# Patient Record
Sex: Female | Born: 1935 | Race: White | Hispanic: No | Smoking: Never smoker
Health system: Southern US, Community
[De-identification: ages and names within clinical notes are randomized; demographics above are authoritative.]

## PROBLEM LIST (undated history)

## (undated) DIAGNOSIS — K5790 Diverticulosis of intestine, part unspecified, without perforation or abscess without bleeding: Secondary | ICD-10-CM

## (undated) DIAGNOSIS — Z86010 Personal history of colonic polyps: Secondary | ICD-10-CM

## (undated) DIAGNOSIS — Z860101 Personal history of adenomatous and serrated colon polyps: Secondary | ICD-10-CM

## (undated) DIAGNOSIS — K219 Gastro-esophageal reflux disease without esophagitis: Secondary | ICD-10-CM

## (undated) DIAGNOSIS — I639 Cerebral infarction, unspecified: Secondary | ICD-10-CM

## (undated) DIAGNOSIS — K449 Diaphragmatic hernia without obstruction or gangrene: Secondary | ICD-10-CM

## (undated) DIAGNOSIS — G459 Transient cerebral ischemic attack, unspecified: Secondary | ICD-10-CM

## (undated) DIAGNOSIS — E039 Hypothyroidism, unspecified: Secondary | ICD-10-CM

## (undated) DIAGNOSIS — K222 Esophageal obstruction: Secondary | ICD-10-CM

## (undated) DIAGNOSIS — D369 Benign neoplasm, unspecified site: Secondary | ICD-10-CM

## (undated) HISTORY — DX: Transient cerebral ischemic attack, unspecified: G45.9

## (undated) HISTORY — DX: Benign neoplasm, unspecified site: D36.9

## (undated) HISTORY — DX: Personal history of adenomatous and serrated colon polyps: Z86.0101

## (undated) HISTORY — DX: Diverticulosis of intestine, part unspecified, without perforation or abscess without bleeding: K57.90

## (undated) HISTORY — DX: Hypothyroidism, unspecified: E03.9

## (undated) HISTORY — DX: Personal history of colonic polyps: Z86.010

## (undated) HISTORY — DX: Diaphragmatic hernia without obstruction or gangrene: K44.9

## (undated) HISTORY — PX: APPENDECTOMY: SHX54

## (undated) HISTORY — DX: Cerebral infarction, unspecified: I63.9

## (undated) HISTORY — DX: Gastro-esophageal reflux disease without esophagitis: K21.9

## (undated) HISTORY — DX: Esophageal obstruction: K22.2

## (undated) HISTORY — PX: ABDOMINAL HYSTERECTOMY: SHX81

---

## 1976-01-27 HISTORY — PX: OTHER SURGICAL HISTORY: SHX169

## 1999-01-27 HISTORY — PX: OTHER SURGICAL HISTORY: SHX169

## 2002-01-26 HISTORY — PX: ESOPHAGOGASTRODUODENOSCOPY: SHX1529

## 2002-02-01 ENCOUNTER — Ambulatory Visit (HOSPITAL_COMMUNITY): Admission: RE | Admit: 2002-02-01 | Discharge: 2002-02-01 | Payer: Self-pay | Admitting: Internal Medicine

## 2004-09-08 ENCOUNTER — Ambulatory Visit: Payer: Self-pay | Admitting: Internal Medicine

## 2005-11-06 ENCOUNTER — Ambulatory Visit: Payer: Self-pay | Admitting: Internal Medicine

## 2006-07-27 HISTORY — PX: COLONOSCOPY: SHX174

## 2006-08-19 ENCOUNTER — Encounter: Payer: Self-pay | Admitting: Internal Medicine

## 2006-08-19 ENCOUNTER — Ambulatory Visit: Payer: Self-pay | Admitting: Internal Medicine

## 2006-08-19 ENCOUNTER — Ambulatory Visit (HOSPITAL_COMMUNITY): Admission: RE | Admit: 2006-08-19 | Discharge: 2006-08-19 | Payer: Self-pay | Admitting: Internal Medicine

## 2006-08-19 DIAGNOSIS — D369 Benign neoplasm, unspecified site: Secondary | ICD-10-CM

## 2006-08-19 HISTORY — DX: Benign neoplasm, unspecified site: D36.9

## 2008-05-09 DIAGNOSIS — E039 Hypothyroidism, unspecified: Secondary | ICD-10-CM | POA: Insufficient documentation

## 2008-05-09 DIAGNOSIS — K219 Gastro-esophageal reflux disease without esophagitis: Secondary | ICD-10-CM

## 2008-05-09 DIAGNOSIS — G459 Transient cerebral ischemic attack, unspecified: Secondary | ICD-10-CM | POA: Insufficient documentation

## 2008-05-09 DIAGNOSIS — N809 Endometriosis, unspecified: Secondary | ICD-10-CM | POA: Insufficient documentation

## 2008-05-09 DIAGNOSIS — K222 Esophageal obstruction: Secondary | ICD-10-CM | POA: Insufficient documentation

## 2008-05-10 ENCOUNTER — Ambulatory Visit: Payer: Self-pay | Admitting: Internal Medicine

## 2008-05-11 DIAGNOSIS — D126 Benign neoplasm of colon, unspecified: Secondary | ICD-10-CM | POA: Insufficient documentation

## 2009-06-12 ENCOUNTER — Encounter: Payer: Self-pay | Admitting: Gastroenterology

## 2009-10-23 ENCOUNTER — Telehealth (INDEPENDENT_AMBULATORY_CARE_PROVIDER_SITE_OTHER): Payer: Self-pay

## 2010-02-25 NOTE — Progress Notes (Signed)
Summary: pt needs printed rx for nexium  Phone Note Call from Patient Call back at Home Phone [phone]   Caller: Patient Summary of Call: pt called- she is trying to get patient assistance for nexium. she has already filled out paperwork and sent it to [employer]. She needs printed rx faxed to them at [phone]. Initial call taken by: Hendricks Limes LPN,  October 23, 2009 10:46 AM     Appended Document: pt needs printed rx for nexium    Prescriptions: NEXIUM 40 MG CPDR (ESOMEPRAZOLE MAGNESIUM) one by mouth 30 mins before breakfast daily  #30 x 11   Entered and Authorized by:   Leanna Battles. Dixon Boos   Signed by:   Leanna Battles Lewis PA-C on 10/23/2009   Method used:   Printed then faxed to ...         RxID:   [id number]     Appended Document: pt needs printed rx for nexium rx faxed to [facility]

## 2010-02-25 NOTE — Medication Information (Signed)
Summary: RX Folder  RX Folder   Imported By: Peggyann Shoals 06/12/2009 09:10:40  _____________________________________________________________________  External Attachment:    Type:   Image     Comment:   External Document  Appended Document: RX Folder-nexium Prescriptions: NEXIUM 40 MG CPDR (ESOMEPRAZOLE MAGNESIUM) one by mouth 30 mins before breakfast daily  #30 x 11   Entered and Authorized by:   Leanna Battles. Dixon Boos   Signed by:   Leanna Battles Dixon Boos on 06/12/2009   Method used:   Electronically to        [facility]* (retail)       [address]       [place], [place]  [address]       Ph: [phone]       Fax: [phone]   RxID:   [id number]

## 2010-05-13 ENCOUNTER — Encounter: Payer: Self-pay | Admitting: Internal Medicine

## 2010-05-27 ENCOUNTER — Encounter: Payer: Self-pay | Admitting: Gastroenterology

## 2010-05-27 ENCOUNTER — Ambulatory Visit (INDEPENDENT_AMBULATORY_CARE_PROVIDER_SITE_OTHER): Payer: Medicare Other | Admitting: Gastroenterology

## 2010-05-27 DIAGNOSIS — K219 Gastro-esophageal reflux disease without esophagitis: Secondary | ICD-10-CM

## 2010-05-27 DIAGNOSIS — D126 Benign neoplasm of colon, unspecified: Secondary | ICD-10-CM

## 2010-05-27 MED ORDER — ESOMEPRAZOLE MAGNESIUM 40 MG PO CPDR: 40.0000 mg | DELAYED_RELEASE_CAPSULE | Freq: Every day | ORAL | Status: DC

## 2010-05-27 NOTE — Assessment & Plan Note (Signed)
Continue Nexium 40 mg daily. New Rx will be mailed to [employer] [facility] as well as letter to have medication delivered to patient's home address.

## 2010-05-27 NOTE — Assessment & Plan Note (Signed)
Due for surveillance TCS in 07/2011. OV in 06/2011.

## 2010-05-27 NOTE — Progress Notes (Addendum)
Primary Care Physician: Juliette Alcide, [place]  Primary Gastroenterologist:  Dr. Roetta Sessions  Chief Complaint  Patient presents with  . Follow-up    gerd    HPI: Alexandria Jordan is a 77 y.o. female here for followup of chronic GERD. She is doing well on Nexium 40 mg daily. Denies any heartburn, abdominal pain, vomiting, dysphagia, constipation, diarrhea, melena, rectal bleeding. She states she needs a prescription sent to [employer] assistance program as well as a letter asking them to deliver the medication to her house rather than in our office (she lives in [place]). Since her last office visit here she was treated for pneumonia as an outpatient. She transferred her care from Dr. Burna Mortimer to [facility].  Current Outpatient Prescriptions  Medication Sig Dispense Refill  . Aspirin-Dipyridamole (AGGRENOX PO) Take by mouth.        . calcium carbonate (OS-CAL) 600 MG TABS Take 600 mg by mouth 2 (two) times daily with a meal.        . esomeprazole (NEXIUM) 40 MG capsule Take 40 mg by mouth daily before breakfast.        . folic acid (FOLVITE) 1 MG tablet Take 1 mg by mouth daily.        Marland Kitchen levothyroxine (SYNTHROID, LEVOTHROID) 100 MCG tablet Take 100 mcg by mouth daily.        . Thiamine HCl (VITAMIN B-1) 100 MG tablet Take 100 mg by mouth daily.          Allergies as of 05/27/2010 - Review Complete 05/27/2010  Allergen Reaction Noted  . Fulvicin u-f (griseofulvin)  05/22/2010    ROS:  General: Negative for anorexia, weight loss, fever, chills, fatigue, weakness. ENT: Negative for hoarseness, difficulty swallowing , nasal congestion. CV: Negative for chest pain, angina, palpitations, dyspnea on exertion, peripheral edema.  Respiratory: Negative for dyspnea at rest, dyspnea on exertion, cough, sputum, wheezing.  GI: See history of present illness. GU:  Negative for dysuria, hematuria, urinary incontinence, urinary frequency, nocturnal urination.  Endo: Negative for unusual  weight change.    Physical Examination:   BP 125/85  Pulse 92  Temp(Src) 97.6 F (36.4 C) (Tympanic)  Ht 5\' 7"  (1.702 m)  Wt 202 lb 6.4 oz (91.808 kg)  BMI 31.70 kg/m2  General: Well-nourished, well-developed in no acute distress.  Eyes: No icterus. Mouth: Oropharyngeal mucosa moist and pink , no lesions erythema or exudate. Lungs: Clear to auscultation bilaterally.  Heart: Regular rate and rhythm, no murmurs rubs or gallops.  Abdomen: Bowel sounds are normal, nontender, nondistended, no hepatosplenomegaly or masses, no abdominal bruits or hernia , no rebound or guarding.   Extremities: No lower extremity edema.  Neuro: Alert and oriented x 4   Skin: Warm and dry, no jaundice.   Psych: Alert and cooperative, normal mood and affect.

## 2010-05-28 NOTE — Progress Notes (Signed)
Letter and Rx faxed to [employer]

## 2010-06-10 NOTE — Op Note (Signed)
NAME:  Alexandria Jordan, Alexandria Jordan               ACCOUNT NO.:  [id number]   MEDICAL RECORD NO.:  [id number]          PATIENT TYPE:  AMB   LOCATION:  DAY                           FACILITY:  [facility]   PHYSICIAN:  R. Roetta Sessions, M.D. DATE OF BIRTH:  02-20-1931   DATE OF PROCEDURE:  08/19/2006  DATE OF DISCHARGE:                               OPERATIVE REPORT   PROCEDURE:  Colonoscopy with biopsy.   INDICATIONS FOR PROCEDURE:  A 77 year old lady sent down by the courtesy  of Dr. __________ for colorectal cancer screening.  She had a  colonoscopy a number of years ago down at [facility], and there is no family  history of colon cancer.  She does not have any lower GI tract symptoms  currently.  Colonoscopy is now being done.  This has been discussed with  the patient at length.  Potential risks, benefits, and alternatives have  been reviewed, questions answered.  She is agreeable.  Please see  documentation in medical record.   PROCEDURE NOTE:  O2 saturations, blood pressure, pulse, respirations  were monitored throughout the entire procedure.  Conscious sedation of  Versed 3 mg IV, Demerol 75 mg IV in divided doses.   INSTRUMENT:  Pentax video chip system.   FINDINGS:  Digital rectal exam revealed no abnormalities.   ENDOSCOPIC FINDINGS:  Prep was suboptimal.   COLON:  Colonic mucosa was surveyed from the rectosigmoid junction to  the left transverse right colon to the appendiceal orifice, ileocecal  valve, and cecum.  This juncture was well seen and photographed for the  record.  From this level, the scope was slowly withdrawn.  All  previously mentioned mucosal surfaces were again seen.  The patient had  scattered sigmoid diverticula.  There were two 3 mm polyps in the  ascending colon which were cold biopsied/removed.  The remainder of the  colonic mucosa appeared normal.  The scope was pulled down to the rectum  where a thorough examination of the rectal mucosa including retroflexion  and view  of the anal verge demonstrated anal papilla and internal  hemorrhoids only.  The patient tolerated the procedure well and was  reacted in endoscopy.   IMPRESSION:  1. Internal hemorrhoids, anal papilla, otherwise normal rectum.  2. Left-sided diverticula.  3. Diminutive ascending colon polyps chest pain cold biopsy removal.      The remainder of the colonic mucosa appeared normal.   RECOMMENDATIONS:  1. Diverticulosis literature provided to [name].  2. Follow up on pathology.  3. Further recommendations to follow.      Jonathon Bellows, M.D.  Electronically Signed     RMR/MEDQ  D:  08/19/2006  T:  08/19/2006  Job:  [id number]   cc:   Dr. __________, [facility]

## 2010-06-13 NOTE — Op Note (Signed)
   NAME:  Alexandria Jordan, Alexandria Jordan                         ACCOUNT NO.:  [id number]   MEDICAL RECORD NO.:  [id number]                   PATIENT TYPE:  AMB   LOCATION:  DAY                                  FACILITY:  [facility]   PHYSICIAN:  R. Roetta Sessions, M.D.              DATE OF BIRTH:  September 23, 1931   DATE OF PROCEDURE:  02/01/2002  DATE OF DISCHARGE:                                 OPERATIVE REPORT   PROCEDURE:  Diagnostic esophagogastroduodenoscopy.   INDICATION FOR PROCEDURE:  The patient is a 77 year old lady with  intermittent nausea, vomiting, atypical chest discomfort.  Marked  improvement with a two-week course of Nexium.  Cardiac evaluation negative.  EGD is now being done to further evaluate her symptoms.  The approach has  been discussed at length with the patient previously, and the potential  risks, benefits, and alternatives have been reviewed, questions answered.  ASA-2.   DESCRIPTION OF PROCEDURE:  O2 saturation, blood pressure, pulse,  respirations were monitored throughout the entire procedure.   Conscious sedation:  Versed 3 mg IV, Demerol 50 mg IV in divided doses.   Instrument:  Olympus video chip adult gastroscope.   Findings:  Examination of the tubular esophagus revealed a Schatzki's ring.  The esophageal mucosa otherwise appeared normal.  The EG junction was easily  traversed.   Stomach:  The gastric cavity was empty and insufflated well with air.  A  thorough examination of the gastric mucosa, including a retroflexed view of  the proximal stomach and esophagogastric junction, demonstrated only a  moderate-sized hiatal hernia.  The pylorus was patent and easily traversed.   Duodenum:  The bulb and second portion appeared normal.   Therapeutic/diagnostic maneuvers performed:  None.   The patient tolerated the procedure well, was reactive in endoscopy.   IMPRESSION:  1. Schatzki's ring, otherwise normal esophageal mucosa.  2. Small to moderate-sized hiatal  hernia.  Remainder of stomach appeared     normal.  3. Normal D1, D2.  4. The ring was not manipulated because the patient denies dysphagia.    RECOMMENDATIONS:  1. Continue Nexium 40 mg orally every morning.  2. Office visit with me in six weeks.                                               Jonathon Bellows, M.D.    RMR/MEDQ  D:  02/01/2002  T:  02/01/2002  Job:  [id number]   cc:   [place], [place] [address] Genene Churn. Burna Mortimer MD, [facility]

## 2010-06-13 NOTE — Consult Note (Signed)
NAME:  Alexandria Jordan, SISLER                         ACCOUNT NO.:  [id number]   MEDICAL RECORD NO.:  [id number]                  PATIENT TYPE:   LOCATION:                                       FACILITY:  [facility]   PHYSICIAN:  R. Roetta Sessions, M.D.              DATE OF BIRTH:  27-Sep-1931   DATE OF CONSULTATION:  01/04/2002  DATE OF DISCHARGE:                                   CONSULTATION   GASTROENTEROLOGY CONSULTATION:   REASON FOR CONSULTATION:  Chest symptoms, chest discomfort, flushing.   HISTORY OF PRESENT ILLNESS:  The patient is a 77 year old lady referred down  from [facility] at courtesy of Dr. Burna Mortimer to further evaluate at least a  couple-month history of somewhat obscure symptoms characterized by  diaphoresis, tightness in her chest, intermittent nausea and vomiting, says  she has heartburn occasionally but differentiates occasional heartburn  from the above-mentioned symptoms, does not have any odynophagia or  dysphagia, occasionally gets nauseated and vomits, does not have any  associated abdominal pain, it may occur after meals or fasting, has not had  any melena or rectal bleeding, no change in bowel habits otherwise (denies  constipation or diarrhea).  She had what sounds like a fairly extensive  cardiac workup by Dr. Willette Pa in [place], including a stress test,  everything turned out negative or normal as she reports.  She has had  multiple laboratory studies through the [facility].  I have  copies of a metabolic-7, chem-20's and a CBC, she had normal LFTs as well.  She tells me she was started on a 2-week course of Nexium 40 mg orally daily  with marked/dramatic improvement in her symptoms.  She came off the  medication and was subsequently put on some Protonix which she did not feel  helped her as much.  She does not take any nonsteroidals.  She has no  history of peptic ulcer disease.  She tells me that she has diaphoresis and  sometimes flushing ever  since her neurologist at [facility] took her off Provera  some 2 years ago after she had a TIA.  She was told at that time that she  could expect such reactions per her report.  She had a colonoscopy by Dr.  June Leap down at [facility] 4 or 5 years ago; she tells me only diverticulosis  was found.  There is no family history of gastrointestinal malignancy.  She  has had a 6-pound increase in weight in the past 6 months.   PAST MEDICAL HISTORY:  Past medical history significant for what sounds like  a TIA in 2001 and hypothyroidism.   PAST SURGICAL HISTORY:  Past surgery is hysterectomy for endometriosis and  fibroids 1978, bladder tack in 2001.   CURRENT MEDICATIONS:  1. Protonix 40 mg orally daily.  2. Os-Cal b.i.d.  3. Vitamin B daily.  4. Synthroid 1 mg daily.  5. Aggrenox b.i.d.  ALLERGIES:  Frelbicin.   FAMILY HISTORY:  As described above, there is no history of chronic GI or  liver disease.  Father is alive at age 51.  Mother is alive at age 22.   SOCIAL HISTORY:  The patient is widowed.  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  No tobacco or alcohol.   REVIEW OF SYSTEMS:  Has not had any chest pain on exertion, dyspnea, fever,  chills, weight gain as related to above.   PHYSICAL EXAMINATION:  GENERAL:  Pleasant 77 year old lady resting  comfortably.  VITAL SIGNS:  Weight 198, height 5 feet 7 inches, temperature 98.8, BP  130/92, pulse 88.  SKIN:  Warm and dry.  There is no jaundice; no continuous stigmata of  chronic liver disease.  HEENT:  No scleral icterus.  Oral cavity has no lesions and dentition in  fair state of repair.  NECK:  JVD is not prominent.  CHEST:  Lungs clear to auscultation.  CARDIAC:  Regular rate and rhythm without murmur, gallop, rub.  BREASTS:  Deferred.  ABDOMEN:  Nondistended, positive bowel sounds, soft, nontender; no  appreciable mass or organomegaly.  EXTREMITIES:  No edema.   IMPRESSION:  The patient is  a delightful 77 year old lady with a 61-month-so  history of intermittent nausea, vomiting, and atypical chest discomfort.  She has occasional typical reflux symptoms.  She describes her discomfort  from time to time as being squeezing with intermittent diaphoresis.   I suspect her diaphoresis may largely be related to hot flashes.   At least some of her symptoms may well be secondary to gastroesophageal  reflux.  It is reassuring to note that she had a negative cardiac evaluation  recently.  She has really not had any sort of abdominal pain so I doubt  gallbladder disease or other intra-abdominal pathology.   It is significant that she experienced a significant improvement in her  symptoms with a 2-week course of Nexium.  Protonix has not done as well.  Her symptoms have had somewhat of a crescendo pace.   RECOMMENDATIONS:  I have offered the patient EGD in the near future to  directly visualize the luminal surfaces of her upper GI tract to further  evaluate her symptoms.  Discussed this approach with the patient.  Potential  risks, benefits, alternatives have been reviewed.  I have asked her to stop  Protonix and go back on Nexium.  I have  given her a 37-month supply of free samples.  She is to take one 40 mg  capsule at least 30 minutes before breakfast daily.  I will make further  recommendations once EGD has been performed.   I would like to thank Dr. Burna Mortimer for his kind referral.  Further  recommendations to follow.                                               Jonathon Bellows, M.D.    RMR/MEDQ  D:  01/04/2002  T:  01/04/2002  Job:  [id number]   cc:   Genene Churn. Burna Mortimer, M.D.  [facility]  [address]  [place], [place]  Phone [phone]

## 2011-02-17 DIAGNOSIS — E039 Hypothyroidism, unspecified: Secondary | ICD-10-CM | POA: Diagnosis not present

## 2011-02-17 DIAGNOSIS — R7309 Other abnormal glucose: Secondary | ICD-10-CM | POA: Diagnosis not present

## 2011-02-17 DIAGNOSIS — K219 Gastro-esophageal reflux disease without esophagitis: Secondary | ICD-10-CM | POA: Diagnosis not present

## 2011-02-17 DIAGNOSIS — E7800 Pure hypercholesterolemia, unspecified: Secondary | ICD-10-CM | POA: Diagnosis not present

## 2011-02-26 DIAGNOSIS — R7309 Other abnormal glucose: Secondary | ICD-10-CM | POA: Diagnosis not present

## 2011-02-26 DIAGNOSIS — K219 Gastro-esophageal reflux disease without esophagitis: Secondary | ICD-10-CM | POA: Diagnosis not present

## 2011-02-26 DIAGNOSIS — G459 Transient cerebral ischemic attack, unspecified: Secondary | ICD-10-CM | POA: Diagnosis not present

## 2011-02-26 DIAGNOSIS — R05 Cough: Secondary | ICD-10-CM | POA: Diagnosis not present

## 2011-02-26 DIAGNOSIS — R197 Diarrhea, unspecified: Secondary | ICD-10-CM | POA: Diagnosis not present

## 2011-02-26 DIAGNOSIS — E039 Hypothyroidism, unspecified: Secondary | ICD-10-CM | POA: Diagnosis not present

## 2011-03-05 DIAGNOSIS — R197 Diarrhea, unspecified: Secondary | ICD-10-CM | POA: Diagnosis not present

## 2011-03-05 DIAGNOSIS — R05 Cough: Secondary | ICD-10-CM | POA: Diagnosis not present

## 2011-04-02 ENCOUNTER — Telehealth: Payer: Self-pay | Admitting: Internal Medicine

## 2011-04-02 NOTE — Telephone Encounter (Signed)
Patient wants a written Rx  for Nexium to send to [employer] for assistance please advise?? Patient request if we can mail the Rx

## 2011-04-03 MED ORDER — ESOMEPRAZOLE MAGNESIUM 40 MG PO CPDR: 40.0000 mg | DELAYED_RELEASE_CAPSULE | Freq: Every day | ORAL | Status: DC

## 2011-04-04 DIAGNOSIS — S81009A Unspecified open wound, unspecified knee, initial encounter: Secondary | ICD-10-CM | POA: Diagnosis not present

## 2011-04-04 DIAGNOSIS — S060X0A Concussion without loss of consciousness, initial encounter: Secondary | ICD-10-CM | POA: Diagnosis not present

## 2011-04-04 DIAGNOSIS — S0180XA Unspecified open wound of other part of head, initial encounter: Secondary | ICD-10-CM | POA: Diagnosis not present

## 2011-04-04 DIAGNOSIS — W19XXXA Unspecified fall, initial encounter: Secondary | ICD-10-CM | POA: Diagnosis not present

## 2011-04-04 DIAGNOSIS — S40019A Contusion of unspecified shoulder, initial encounter: Secondary | ICD-10-CM | POA: Diagnosis not present

## 2011-04-04 DIAGNOSIS — S0993XA Unspecified injury of face, initial encounter: Secondary | ICD-10-CM | POA: Diagnosis not present

## 2011-04-04 DIAGNOSIS — S0990XA Unspecified injury of head, initial encounter: Secondary | ICD-10-CM | POA: Diagnosis not present

## 2011-04-04 DIAGNOSIS — S99929A Unspecified injury of unspecified foot, initial encounter: Secondary | ICD-10-CM | POA: Diagnosis not present

## 2011-04-04 DIAGNOSIS — S81809A Unspecified open wound, unspecified lower leg, initial encounter: Secondary | ICD-10-CM | POA: Diagnosis not present

## 2011-04-04 DIAGNOSIS — S91009A Unspecified open wound, unspecified ankle, initial encounter: Secondary | ICD-10-CM | POA: Diagnosis not present

## 2011-04-04 DIAGNOSIS — S8990XA Unspecified injury of unspecified lower leg, initial encounter: Secondary | ICD-10-CM | POA: Diagnosis not present

## 2011-04-04 DIAGNOSIS — S199XXA Unspecified injury of neck, initial encounter: Secondary | ICD-10-CM | POA: Diagnosis not present

## 2011-04-04 DIAGNOSIS — S1093XA Contusion of unspecified part of neck, initial encounter: Secondary | ICD-10-CM | POA: Diagnosis not present

## 2011-04-04 DIAGNOSIS — Z043 Encounter for examination and observation following other accident: Secondary | ICD-10-CM | POA: Diagnosis not present

## 2011-04-05 DIAGNOSIS — Z4800 Encounter for change or removal of nonsurgical wound dressing: Secondary | ICD-10-CM | POA: Diagnosis not present

## 2011-04-06 NOTE — Telephone Encounter (Signed)
rx in mail to pt.

## 2011-04-10 DIAGNOSIS — Z4802 Encounter for removal of sutures: Secondary | ICD-10-CM | POA: Diagnosis not present

## 2011-04-15 DIAGNOSIS — Z4802 Encounter for removal of sutures: Secondary | ICD-10-CM | POA: Diagnosis not present

## 2011-05-12 DIAGNOSIS — N8110 Cystocele, unspecified: Secondary | ICD-10-CM | POA: Diagnosis not present

## 2011-05-12 DIAGNOSIS — Z1231 Encounter for screening mammogram for malignant neoplasm of breast: Secondary | ICD-10-CM | POA: Diagnosis not present

## 2011-05-12 DIAGNOSIS — N318 Other neuromuscular dysfunction of bladder: Secondary | ICD-10-CM | POA: Diagnosis not present

## 2011-05-12 DIAGNOSIS — N951 Menopausal and female climacteric states: Secondary | ICD-10-CM | POA: Diagnosis not present

## 2011-05-12 DIAGNOSIS — L280 Lichen simplex chronicus: Secondary | ICD-10-CM | POA: Diagnosis not present

## 2011-05-12 DIAGNOSIS — N952 Postmenopausal atrophic vaginitis: Secondary | ICD-10-CM | POA: Diagnosis not present

## 2011-05-27 DIAGNOSIS — L03119 Cellulitis of unspecified part of limb: Secondary | ICD-10-CM | POA: Diagnosis not present

## 2011-05-27 DIAGNOSIS — L02419 Cutaneous abscess of limb, unspecified: Secondary | ICD-10-CM | POA: Diagnosis not present

## 2011-05-29 ENCOUNTER — Ambulatory Visit (INDEPENDENT_AMBULATORY_CARE_PROVIDER_SITE_OTHER): Payer: Medicare Other | Admitting: Internal Medicine

## 2011-05-29 ENCOUNTER — Encounter: Payer: Self-pay | Admitting: Internal Medicine

## 2011-05-29 VITALS — BP 130/80 | HR 86 | Temp 97.6°F | Ht 67.0 in | Wt 196.0 lb

## 2011-05-29 DIAGNOSIS — D126 Benign neoplasm of colon, unspecified: Secondary | ICD-10-CM

## 2011-05-29 MED ORDER — PEG-KCL-NACL-NASULF-NA ASC-C 100 G PO SOLR: 1.0000 | Freq: Once | ORAL | Status: DC

## 2011-05-29 NOTE — Progress Notes (Signed)
Addended by: Cherene Julian D on: 05/29/2011 11:36 AM   Modules accepted: Orders

## 2011-05-29 NOTE — Patient Instructions (Signed)
Surveillance colonoscopy w split prep

## 2011-05-29 NOTE — Assessment & Plan Note (Signed)
Pleasant 78 year old lady with a history of colonic adenoma; due for smells colonoscopy. GERD symptoms well controlled on Nexium. I discussed the risk and benefits of long-term acid suppression therapy. I told her if she wanted to economize on the medication she can try taking it every other day. However, if this were not satisfactory, please take it every day as I feel the benefits would far outweigh the risks.  Recommendations: Surveillance colonoscopy in the near future.The risks, benefits, limitations, alternatives and imponderables have been reviewed with the patient. Questions have been answered. All parties are agreeable.

## 2011-05-29 NOTE — Progress Notes (Signed)
Primary Care Physician:  BURDINE,STEVEN E, MD, MD Primary Gastroenterologist:  Dr.   Pre-Procedure History & Physical: HPI:  Alexandria Jordan is a 78 y.o. female here for followup colonic adenoma. Last colonoscopy 2008. Descending colon adenoma removed. No bowel symptoms now suffer occasional constipation. Nexium daily is controlling reflux symptoms very well. No Barrett's esophagus 2004 EGD. No dysphagia or other GI symptoms. Past Medical History  Diagnosis Date  . GERD (gastroesophageal reflux disease)   . Hx of adenomatous colonic polyps   . TIA (transient ischemic attack)   . Hypothyroidism   . Adenoma 08/19/2006    tubular adenoma  . Diverticulosis   . Hemorrhoids     Past Surgical History  Procedure Date  . Colonoscopy 07/2006    tubular adenoma, , left-sided diverticula, TCS 07/2010  . Esophagogastroduodenoscopy 01/2002    schatzki ring (not manipulated), small to moderate hh  . Hysterectomy for endometriosis and fibroids 1978  . Bladder tack 2001    Prior to Admission medications   Medication Sig Start Date End Date Taking? Authorizing Provider  AGGRENOX 25-200 MG per 12 hr capsule Take 1 capsule by mouth 2 (two) times daily.  05/13/11  Yes Historical Provider, MD  calcium carbonate (OS-CAL) 600 MG TABS Take 600 mg by mouth 2 (two) times daily with a meal.     Yes Historical Provider, MD  esomeprazole (NEXIUM) 40 MG capsule Take 1 capsule (40 mg total) by mouth daily before breakfast. 04/03/11  Yes Kandice L Jones, NP  folic acid (FOLVITE) 1 MG tablet Take 1 mg by mouth daily.     Yes Historical Provider, MD  levothyroxine (SYNTHROID, LEVOTHROID) 100 MCG tablet Take 100 mcg by mouth daily.     Yes Historical Provider, MD  Thiamine HCl (VITAMIN B-1) 100 MG tablet Take 100 mg by mouth daily.     Yes Historical Provider, MD  vitamin B-12 (CYANOCOBALAMIN) 1000 MCG tablet Take 1,000 mcg by mouth daily.   Yes Historical Provider, MD  DETROL LA 4 MG 24 hr capsule  05/13/11   Historical  Provider, MD    Allergies as of 05/29/2011 - Review Complete 05/29/2011  Allergen Reaction Noted  . Fulvicin u-f (griseofulvin)  05/22/2010    No family history on file.  History   Social History  . Marital Status: Widowed    Spouse Name: N/A    Number of Children: N/A  . Years of Education: N/A   Occupational History  . Not on file.   Social History Main Topics  . Smoking status: Never Smoker   . Smokeless tobacco: Not on file  . Alcohol Use: No  . Drug Use: No  . Sexually Active: Not on file   Other Topics Concern  . Not on file   Social History Narrative  . No narrative on file    Review of Systems: See HPI, otherwise negative ROS  Physical Exam: BP 130/80  Pulse 86  Temp(Src) 97.6 F (36.4 C) (Temporal)  Ht 5' 7" (1.702 m)  Wt 196 lb (88.905 kg)  BMI 30.70 kg/m2 General:   Alert,  Well-developed, well-nourished, pleasant and cooperative in NAD Skin:  Intact without significant lesions or rashes. Eyes:  Sclera clear, no icterus.   Conjunctiva pink. Ears:  Normal auditory acuity. Nose:  No deformity, discharge,  or lesions. Mouth:  No deformity or lesions. Neck:  Supple; no masses or thyromegaly. No significant cervical adenopathy. Lungs:  Clear throughout to auscultation.   No wheezes, crackles, or rhonchi.   No acute distress. Heart:  Regular rate and rhythm; no murmurs, clicks, rubs,  or gallops. Abdomen: Non-distended, normal bowel sounds.  Soft and nontender without appreciable mass or hepatosplenomegaly.  Pulses:  Normal pulses noted. Extremities:  Without clubbing or edema.  

## 2011-06-09 ENCOUNTER — Telehealth: Payer: Self-pay | Admitting: Gastroenterology

## 2011-06-09 NOTE — Telephone Encounter (Signed)
Pt informed

## 2011-06-09 NOTE — Telephone Encounter (Signed)
Pt called asking if she needs to stop her Aggrenox before her TCS next week

## 2011-06-09 NOTE — Telephone Encounter (Signed)
No, she does not need to stop her Aggrenox for colonoscopy

## 2011-06-15 ENCOUNTER — Encounter (HOSPITAL_COMMUNITY): Payer: Self-pay | Admitting: Pharmacy Technician

## 2011-06-16 MED ORDER — SODIUM CHLORIDE 0.45 % IV SOLN
Freq: Once | INTRAVENOUS | Status: AC
  Administered 2011-06-17: 10:00:00 via INTRAVENOUS

## 2011-06-17 ENCOUNTER — Ambulatory Visit (HOSPITAL_COMMUNITY)
Admission: RE | Admit: 2011-06-17 | Discharge: 2011-06-17 | Disposition: A | Discharge status: Home / Self Care | Payer: Medicare Other | Source: Ambulatory Visit | Attending: Internal Medicine | Admitting: Internal Medicine

## 2011-06-17 ENCOUNTER — Encounter (HOSPITAL_COMMUNITY): Payer: Self-pay | Admitting: *Deleted

## 2011-06-17 ENCOUNTER — Encounter (HOSPITAL_COMMUNITY)
Admission: RE | Disposition: A | Discharge status: Home / Self Care | Payer: Self-pay | Source: Ambulatory Visit | Attending: Internal Medicine

## 2011-06-17 DIAGNOSIS — Z860100 Personal history of colon polyps, unspecified: Secondary | ICD-10-CM | POA: Insufficient documentation

## 2011-06-17 DIAGNOSIS — Z09 Encounter for follow-up examination after completed treatment for conditions other than malignant neoplasm: Secondary | ICD-10-CM | POA: Diagnosis not present

## 2011-06-17 DIAGNOSIS — Z1211 Encounter for screening for malignant neoplasm of colon: Secondary | ICD-10-CM

## 2011-06-17 DIAGNOSIS — K5730 Diverticulosis of large intestine without perforation or abscess without bleeding: Secondary | ICD-10-CM | POA: Insufficient documentation

## 2011-06-17 DIAGNOSIS — D126 Benign neoplasm of colon, unspecified: Secondary | ICD-10-CM | POA: Insufficient documentation

## 2011-06-17 HISTORY — PX: COLONOSCOPY: SHX5424

## 2011-06-17 SURGERY — COLONOSCOPY
Anesthesia: Moderate Sedation

## 2011-06-17 MED ORDER — MEPERIDINE HCL 100 MG/ML IJ SOLN
INTRAMUSCULAR | Status: AC
  Filled 2011-06-17: qty 1

## 2011-06-17 MED ORDER — MEPERIDINE HCL 100 MG/ML IJ SOLN
INTRAMUSCULAR | Status: DC | PRN
  Administered 2011-06-17: 50 mg via INTRAVENOUS
  Administered 2011-06-17: 25 mg via INTRAVENOUS

## 2011-06-17 MED ORDER — STERILE WATER FOR IRRIGATION IR SOLN
Status: DC | PRN
  Administered 2011-06-17: 11:00:00

## 2011-06-17 MED ORDER — MIDAZOLAM HCL 5 MG/5ML IJ SOLN
INTRAMUSCULAR | Status: AC
  Filled 2011-06-17: qty 10

## 2011-06-17 MED ORDER — MIDAZOLAM HCL 5 MG/5ML IJ SOLN
INTRAMUSCULAR | Status: DC | PRN
  Administered 2011-06-17: 2 mg via INTRAVENOUS
  Administered 2011-06-17 (×2): 1 mg via INTRAVENOUS

## 2011-06-17 NOTE — Interval H&P Note (Signed)
History and Physical Interval Note:  06/17/2011 10:29 AM  Alexandria Jordan  has presented today for surgery, with the diagnosis of hx of polyps  The various methods of treatment have been discussed with the patient and family. After consideration of risks, benefits and other options for treatment, the patient has consented to  Procedure(s) (LRB): COLONOSCOPY (N/A) as a surgical intervention .  The patients' history has been reviewed, patient examined, no change in status, stable for surgery.  I have reviewed the patients' chart and labs.  Questions were answered to the patient's satisfaction.     Eula Listen

## 2011-06-17 NOTE — Discharge Instructions (Signed)
Colonoscopy Discharge Instructions  Read the instructions outlined below and refer to this sheet in the next few weeks. These discharge instructions provide you with general information on caring for yourself after you leave the hospital. Your doctor may also give you specific instructions. While your treatment has been planned according to the most current medical practices available, unavoidable complications occasionally occur. If you have any problems or questions after discharge, call Dr. Jena Gauss at [phone]. ACTIVITY  You may resume your regular activity, but move at a slower pace for the next 24 hours.   Take frequent rest periods for the next 24 hours.   Walking will help get rid of the air and reduce the bloated feeling in your belly (abdomen).   No driving for 24 hours (because of the medicine (anesthesia) used during the test).    Do not sign any important legal documents or operate any machinery for 24 hours (because of the anesthesia used during the test).  NUTRITION  Drink plenty of fluids.   You may resume your normal diet as instructed by your doctor.   Begin with a light meal and progress to your normal diet. Heavy or fried foods are harder to digest and may make you feel sick to your stomach (nauseated).   Avoid alcoholic beverages for 24 hours or as instructed.  MEDICATIONS  You may resume your normal medications unless your doctor tells you otherwise.  WHAT YOU CAN EXPECT TODAY  Some feelings of bloating in the abdomen.   Passage of more gas than usual.   Spotting of blood in your stool or on the toilet paper.  IF YOU HAD POLYPS REMOVED DURING THE COLONOSCOPY:  No aspirin products for 7 days or as instructed.   No alcohol for 7 days or as instructed.   Eat a soft diet for the next 24 hours.  FINDING OUT THE RESULTS OF YOUR TEST Not all test results are available during your visit. If your test results are not back during the visit, make an appointment  with your caregiver to find out the results. Do not assume everything is normal if you have not heard from your caregiver or the medical facility. It is important for you to follow up on all of your test results.  SEEK IMMEDIATE MEDICAL ATTENTION IF:  You have more than a spotting of blood in your stool.   Your belly is swollen (abdominal distention).   You are nauseated or vomiting.   You have a temperature over 101.   You have abdominal pain or discomfort that is severe or gets worse throughout the day.    Polyp and diverticulosis information provided.  Further recommendations to follow pending review of pathology report.Diverticulosis Diverticulosis is a common condition that develops when small pouches (diverticula) form in the wall of the colon. The risk of diverticulosis increases with age. It happens more often in people who eat a low-fiber diet. Most individuals with diverticulosis have no symptoms. Those individuals with symptoms usually experience abdominal pain, constipation, or loose stools (diarrhea). HOME CARE INSTRUCTIONS   Increase the amount of fiber in your diet as directed by your caregiver or dietician. This may reduce symptoms of diverticulosis.   Your caregiver may recommend taking a dietary fiber supplement.   Drink at least 6 to 8 glasses of water each day to prevent constipation.   Try not to strain when you have a bowel movement.   Your caregiver may recommend avoiding nuts and seeds to prevent complications, although  this is still an uncertain benefit.   Only take over-the-counter or prescription medicines for pain, discomfort, or fever as directed by your caregiver.  FOODS WITH HIGH FIBER CONTENT INCLUDE:  Fruits. Apple, peach, pear, tangerine, raisins, prunes.   Vegetables. Brussels sprouts, asparagus, broccoli, cabbage, carrot, cauliflower, romaine lettuce, spinach, summer squash, tomato, winter squash, zucchini.   Starchy Vegetables. Baked beans,  kidney beans, lima beans, split peas, lentils, potatoes (with skin).   Grains. Whole wheat bread, brown rice, bran flake cereal, plain oatmeal, white rice, shredded wheat, bran muffins.  SEEK IMMEDIATE MEDICAL CARE IF:   You develop increasing pain or severe bloating.   You have an oral temperature above 102 F (38.9 C), not controlled by medicine.   You develop vomiting or bowel movements that are bloody or black.  Document Released: 10/10/2003 Document Revised: 01/01/2011 Document Reviewed: 06/12/2009 [facility] Patient Information 2012 [place], [place].Colon Polyps A polyp is extra tissue that grows inside your body. Colon polyps grow in the large intestine. The large intestine, also called the colon, is part of your digestive system. It is a long, hollow tube at the end of your digestive tract where your body makes and stores stool. Most polyps are not dangerous. They are benign. This means they are not cancerous. But over time, some types of polyps can turn into cancer. Polyps that are smaller than a pea are usually not harmful. But larger polyps could someday become or may already be cancerous. To be safe, doctors remove all polyps and test them.  WHO GETS POLYPS? Anyone can get polyps, but certain people are more likely than others. You may have a greater chance of getting polyps if:  You are over 50.   You have had polyps before.   Someone in your family has had polyps.   Someone in your family has had cancer of the large intestine.   Find out if someone in your family has had polyps. You may also be more likely to get polyps if you:   Eat a lot of fatty foods.   Smoke.   Drink alcohol.   Do not exercise.   Eat too much.  SYMPTOMS  Most small polyps do not cause symptoms. People often do not know they have one until their caregiver finds it during a regular checkup or while testing them for something else. Some people do have symptoms like these:  Bleeding from the anus.  You might notice blood on your underwear or on toilet paper after you have had a bowel movement.   Constipation or diarrhea that lasts more than a week.   Blood in the stool. Blood can make stool look black or it can show up as red streaks in the stool.  If you have any of these symptoms, see your caregiver. HOW DOES THE DOCTOR TEST FOR POLYPS? The doctor can use four tests to check for polyps:  Digital rectal exam. The caregiver wears gloves and checks your rectum (the last part of the large intestine) to see if it feels normal. This test would find polyps only in the rectum. Your caregiver may need to do one of the other tests listed below to find polyps higher up in the intestine.   Barium enema. The caregiver puts a liquid called barium into your rectum before taking x-rays of your large intestine. Barium makes your intestine look white in the pictures. Polyps are dark, so they are easy to see.   Sigmoidoscopy. With this test, the caregiver can  see inside your large intestine. A thin flexible tube is placed into your rectum. The device is called a sigmoidoscope, which has a light and a tiny video camera in it. The caregiver uses the sigmoidoscope to look at the last third of your large intestine.   Colonoscopy. This test is like sigmoidoscopy, but the caregiver looks at all of the large intestine. It usually requires sedation. This is the most common method for finding and removing polyps.  TREATMENT   The caregiver will remove the polyp during sigmoidoscopy or colonoscopy. The polyp is then tested for cancer.   If you have had polyps, your caregiver may want you to get tested regularly in the future.  PREVENTION  There is not one sure way to prevent polyps. You might be able to lower your risk of getting them if you:  Eat more fruits and vegetables and less fatty food.   Do not smoke.   Avoid alcohol.   Exercise every day.   Lose weight if you are overweight.   Eating more  calcium and folate can also lower your risk of getting polyps. Some foods that are rich in calcium are milk, cheese, and broccoli. Some foods that are rich in folate are chickpeas, kidney beans, and spinach.   Aspirin might help prevent polyps. Studies are under way.  Document Released: 10/09/2003 Document Revised: 01/01/2011 Document Reviewed: 03/16/2007 [facility] Patient Information 2012 [place], [place].

## 2011-06-17 NOTE — H&P (View-Only) (Signed)
Primary Care Physician:  Juliette Alcide, MD, MD Primary Gastroenterologist:  Dr.   Pre-Procedure History & Physical: HPI:  SERINE KEA is a 78 y.o. female here for followup colonic adenoma. Last colonoscopy 2008. Descending colon adenoma removed. No bowel symptoms now suffer occasional constipation. Nexium daily is controlling reflux symptoms very well. No Barrett's esophagus 2004 EGD. No dysphagia or other GI symptoms. Past Medical History  Diagnosis Date  . GERD (gastroesophageal reflux disease)   . Hx of adenomatous colonic polyps   . TIA (transient ischemic attack)   . Hypothyroidism   . Adenoma 08/19/2006    tubular adenoma  . Diverticulosis   . Hemorrhoids     Past Surgical History  Procedure Date  . Colonoscopy 07/2006    tubular adenoma, , left-sided diverticula, TCS 07/2010  . Esophagogastroduodenoscopy 01/2002    schatzki ring (not manipulated), small to moderate hh  . Hysterectomy for endometriosis and fibroids 1978  . Bladder tack 2001    Prior to Admission medications   Medication Sig Start Date End Date Taking? Authorizing Provider  AGGRENOX 25-200 MG per 12 hr capsule Take 1 capsule by mouth 2 (two) times daily.  05/13/11  Yes Historical Provider, MD  calcium carbonate (OS-CAL) 600 MG TABS Take 600 mg by mouth 2 (two) times daily with a meal.     Yes Historical Provider, MD  esomeprazole (NEXIUM) 40 MG capsule Take 1 capsule (40 mg total) by mouth daily before breakfast. 04/03/11  Yes Joselyn Arrow, NP  folic acid (FOLVITE) 1 MG tablet Take 1 mg by mouth daily.     Yes Historical Provider, MD  levothyroxine (SYNTHROID, LEVOTHROID) 100 MCG tablet Take 100 mcg by mouth daily.     Yes Historical Provider, MD  Thiamine HCl (VITAMIN B-1) 100 MG tablet Take 100 mg by mouth daily.     Yes Historical Provider, MD  vitamin B-12 (CYANOCOBALAMIN) 1000 MCG tablet Take 1,000 mcg by mouth daily.   Yes Historical Provider, MD  DETROL LA 4 MG 24 hr capsule  05/13/11   Historical  Provider, MD    Allergies as of 05/29/2011 - Review Complete 05/29/2011  Allergen Reaction Noted  . Fulvicin u-f (griseofulvin)  05/22/2010    No family history on file.  History   Social History  . Marital Status: Widowed    Spouse Name: N/A    Number of Children: N/A  . Years of Education: N/A   Occupational History  . Not on file.   Social History Main Topics  . Smoking status: Never Smoker   . Smokeless tobacco: Not on file  . Alcohol Use: No  . Drug Use: No  . Sexually Active: Not on file   Other Topics Concern  . Not on file   Social History Narrative  . No narrative on file    Review of Systems: See HPI, otherwise negative ROS  Physical Exam: BP 130/80  Pulse 86  Temp(Src) 97.6 F (36.4 C) (Temporal)  Ht 5\' 7"  (1.702 m)  Wt 196 lb (88.905 kg)  BMI 30.70 kg/m2 General:   Alert,  Well-developed, well-nourished, pleasant and cooperative in NAD Skin:  Intact without significant lesions or rashes. Eyes:  Sclera clear, no icterus.   Conjunctiva pink. Ears:  Normal auditory acuity. Nose:  No deformity, discharge,  or lesions. Mouth:  No deformity or lesions. Neck:  Supple; no masses or thyromegaly. No significant cervical adenopathy. Lungs:  Clear throughout to auscultation.   No wheezes, crackles, or rhonchi.  No acute distress. Heart:  Regular rate and rhythm; no murmurs, clicks, rubs,  or gallops. Abdomen: Non-distended, normal bowel sounds.  Soft and nontender without appreciable mass or hepatosplenomegaly.  Pulses:  Normal pulses noted. Extremities:  Without clubbing or edema.

## 2011-06-17 NOTE — Op Note (Signed)
[facility] [address] [place], [place]  [address]  COLONOSCOPY PROCEDURE REPORT  PATIENT:  Alexandria Jordan, Alexandria Jordan  MR#:  [id number] BIRTHDATE:  [birth year], 79 yrs. old  GENDER:  female ENDOSCOPIST:  R. Roetta Sessions, MD FACP [facility] REF. BY:  Quintin Alto, M.D. PROCEDURE DATE:  06/17/2011 PROCEDURE:  colonoscopy snare polypectomy and biopsy  INDICATIONS:  history of colonic adenoma; surveillance examination  INFORMED CONSENT:  The risks, benefits, alternatives and imponderables including but not limited to bleeding, perforation as well as the possibility of a missed lesion have been reviewed. The potential for biopsy, lesion removal, etc. have also been discussed.  Questions have been answered.  All parties agreeable. Please see the history and physical in the medical record for more information.  MEDICATIONS:  Versed 4 mg IV and Demerol 75 mg IV in divided doses.  DESCRIPTION OF PROCEDURE:  After a digital rectal exam was performed, the EC-[id number] ([id number]) colonoscope was advanced from the anus through the rectum and colon to the area of the cecum, ileocecal valve and appendiceal orifice.  The cecum was deeply intubated.  These structures were well-seen and photographed for the record.  From the level of the cecum and ileocecal valve, the scope was slowly and cautiously withdrawn.  The mucosal surfaces were carefully surveyed utilizing scope tip deflection to facilitate fold flattening as needed.  The scope was pulled down into the rectum where a thorough examination  was performed. <<PROCEDUREIMAGES>>  FINDINGS:adequate preparation. Normal rectum. Unable to retroflex. Rectal mucosa seen well. Sigmoid and descending diverticula; 8 mm flat polyp in between 2 folds in the ascending colon. A single diminutive polyp noted in the mid sigmoid; the remainder of the colon appeared normal.  THERAPEUTIC / DIAGNOSTIC MANEUVERS PERFORMED:   the above polyps were hot snared and  cold biopsied removed, respectively  COMPLICATIONS:  none  CECAL WITHDRAWAL TIME:14 minutes  IMPRESSION:  Colonic diverticulosis. Colonic polyps-removed as described above  RECOMMENDATIONS:  Followup on pathology  ______________________________ R. Roetta Sessions, MD Caleen Essex  CC:  Quintin Alto, M.D.  n. eSIGNED:   R. Roetta Sessions at 06/17/2011 11:05 AM  Rolanda Lundborg, [phone]

## 2011-06-19 ENCOUNTER — Encounter (HOSPITAL_COMMUNITY): Payer: Self-pay | Admitting: Internal Medicine

## 2011-06-21 ENCOUNTER — Encounter: Payer: Self-pay | Admitting: Internal Medicine

## 2011-07-02 ENCOUNTER — Telehealth: Payer: Self-pay

## 2011-07-02 NOTE — Telephone Encounter (Signed)
Pt called- she needs new rx sent to [facility] for nexium. She was here for ov in May. It needs to be written for 90 day supply, to cover the rest of the year. The fax number is [phone]

## 2011-07-03 MED ORDER — ESOMEPRAZOLE MAGNESIUM 40 MG PO CPDR: 40.0000 mg | DELAYED_RELEASE_CAPSULE | Freq: Every day | ORAL | Status: DC

## 2011-07-03 NOTE — Telephone Encounter (Signed)
rx faxed

## 2011-07-03 NOTE — Telephone Encounter (Signed)
RX printed. Please fax

## 2011-07-27 DIAGNOSIS — R7309 Other abnormal glucose: Secondary | ICD-10-CM | POA: Diagnosis not present

## 2011-07-27 DIAGNOSIS — E039 Hypothyroidism, unspecified: Secondary | ICD-10-CM | POA: Diagnosis not present

## 2011-08-03 DIAGNOSIS — E039 Hypothyroidism, unspecified: Secondary | ICD-10-CM | POA: Diagnosis not present

## 2011-08-03 DIAGNOSIS — K219 Gastro-esophageal reflux disease without esophagitis: Secondary | ICD-10-CM | POA: Diagnosis not present

## 2011-08-03 DIAGNOSIS — G459 Transient cerebral ischemic attack, unspecified: Secondary | ICD-10-CM | POA: Diagnosis not present

## 2011-08-03 DIAGNOSIS — R7309 Other abnormal glucose: Secondary | ICD-10-CM | POA: Diagnosis not present

## 2011-08-03 DIAGNOSIS — R197 Diarrhea, unspecified: Secondary | ICD-10-CM | POA: Diagnosis not present

## 2011-08-31 DIAGNOSIS — H10509 Unspecified blepharoconjunctivitis, unspecified eye: Secondary | ICD-10-CM | POA: Diagnosis not present

## 2011-09-02 DIAGNOSIS — H16109 Unspecified superficial keratitis, unspecified eye: Secondary | ICD-10-CM | POA: Diagnosis not present

## 2011-09-14 DIAGNOSIS — H16109 Unspecified superficial keratitis, unspecified eye: Secondary | ICD-10-CM | POA: Diagnosis not present

## 2011-09-14 DIAGNOSIS — H16229 Keratoconjunctivitis sicca, not specified as Sjogren's, unspecified eye: Secondary | ICD-10-CM | POA: Diagnosis not present

## 2011-09-14 DIAGNOSIS — H00029 Hordeolum internum unspecified eye, unspecified eyelid: Secondary | ICD-10-CM | POA: Diagnosis not present

## 2011-09-15 DIAGNOSIS — L299 Pruritus, unspecified: Secondary | ICD-10-CM | POA: Diagnosis not present

## 2011-09-15 DIAGNOSIS — L820 Inflamed seborrheic keratosis: Secondary | ICD-10-CM | POA: Diagnosis not present

## 2011-09-15 DIAGNOSIS — L719 Rosacea, unspecified: Secondary | ICD-10-CM | POA: Diagnosis not present

## 2011-09-15 DIAGNOSIS — Q828 Other specified congenital malformations of skin: Secondary | ICD-10-CM | POA: Diagnosis not present

## 2011-11-05 DIAGNOSIS — M25579 Pain in unspecified ankle and joints of unspecified foot: Secondary | ICD-10-CM | POA: Diagnosis not present

## 2011-11-05 DIAGNOSIS — K219 Gastro-esophageal reflux disease without esophagitis: Secondary | ICD-10-CM | POA: Diagnosis not present

## 2011-11-17 DIAGNOSIS — Z23 Encounter for immunization: Secondary | ICD-10-CM | POA: Diagnosis not present

## 2011-12-09 DIAGNOSIS — H00029 Hordeolum internum unspecified eye, unspecified eyelid: Secondary | ICD-10-CM | POA: Diagnosis not present

## 2011-12-09 DIAGNOSIS — H2510 Age-related nuclear cataract, unspecified eye: Secondary | ICD-10-CM | POA: Diagnosis not present

## 2012-01-01 DIAGNOSIS — E039 Hypothyroidism, unspecified: Secondary | ICD-10-CM | POA: Diagnosis not present

## 2012-01-01 DIAGNOSIS — K219 Gastro-esophageal reflux disease without esophagitis: Secondary | ICD-10-CM | POA: Diagnosis not present

## 2012-01-01 DIAGNOSIS — R7309 Other abnormal glucose: Secondary | ICD-10-CM | POA: Diagnosis not present

## 2012-01-01 DIAGNOSIS — G459 Transient cerebral ischemic attack, unspecified: Secondary | ICD-10-CM | POA: Diagnosis not present

## 2012-01-01 DIAGNOSIS — R197 Diarrhea, unspecified: Secondary | ICD-10-CM | POA: Diagnosis not present

## 2012-01-06 DIAGNOSIS — L719 Rosacea, unspecified: Secondary | ICD-10-CM | POA: Diagnosis not present

## 2012-01-06 DIAGNOSIS — R7309 Other abnormal glucose: Secondary | ICD-10-CM | POA: Diagnosis not present

## 2012-01-06 DIAGNOSIS — K219 Gastro-esophageal reflux disease without esophagitis: Secondary | ICD-10-CM | POA: Diagnosis not present

## 2012-01-06 DIAGNOSIS — E039 Hypothyroidism, unspecified: Secondary | ICD-10-CM | POA: Diagnosis not present

## 2012-01-06 DIAGNOSIS — G459 Transient cerebral ischemic attack, unspecified: Secondary | ICD-10-CM | POA: Diagnosis not present

## 2012-03-04 DIAGNOSIS — E039 Hypothyroidism, unspecified: Secondary | ICD-10-CM | POA: Diagnosis not present

## 2012-03-23 ENCOUNTER — Telehealth: Payer: Self-pay | Admitting: Internal Medicine

## 2012-03-23 NOTE — Telephone Encounter (Signed)
Yes

## 2012-03-23 NOTE — Telephone Encounter (Signed)
#  6 boxes are at the front desk and pts sister is aware and will pick up for her.

## 2012-03-23 NOTE — Telephone Encounter (Signed)
Routing to refill box- Is it ok to give pt nexium samples?

## 2012-03-23 NOTE — Telephone Encounter (Signed)
Pt's sister [facility]) called to make OV with RMR ONLY for her and the patient on 4/4. Pt is in need of Nexium and asked could she have samples to hold her until her OV with RMR because she does not want to see NP. Please advise and call pt's sister at [phone]

## 2012-03-24 DIAGNOSIS — H04129 Dry eye syndrome of unspecified lacrimal gland: Secondary | ICD-10-CM | POA: Diagnosis not present

## 2012-03-24 DIAGNOSIS — K029 Dental caries, unspecified: Secondary | ICD-10-CM | POA: Diagnosis not present

## 2012-03-24 DIAGNOSIS — E039 Hypothyroidism, unspecified: Secondary | ICD-10-CM | POA: Diagnosis not present

## 2012-03-24 DIAGNOSIS — R05 Cough: Secondary | ICD-10-CM | POA: Diagnosis not present

## 2012-03-24 DIAGNOSIS — K219 Gastro-esophageal reflux disease without esophagitis: Secondary | ICD-10-CM | POA: Diagnosis not present

## 2012-04-29 ENCOUNTER — Ambulatory Visit: Payer: Medicare Other | Admitting: Internal Medicine

## 2012-05-03 ENCOUNTER — Ambulatory Visit: Payer: Medicare Other | Admitting: Internal Medicine

## 2012-05-09 DIAGNOSIS — E039 Hypothyroidism, unspecified: Secondary | ICD-10-CM | POA: Diagnosis not present

## 2012-05-17 ENCOUNTER — Encounter: Payer: Self-pay | Admitting: Internal Medicine

## 2012-05-17 ENCOUNTER — Other Ambulatory Visit: Payer: Self-pay | Admitting: Internal Medicine

## 2012-05-17 ENCOUNTER — Ambulatory Visit (INDEPENDENT_AMBULATORY_CARE_PROVIDER_SITE_OTHER): Payer: Medicare Other | Admitting: Internal Medicine

## 2012-05-17 VITALS — BP 141/89 | HR 87 | Temp 98.4°F | Ht 67.0 in | Wt 191.4 lb

## 2012-05-17 DIAGNOSIS — K219 Gastro-esophageal reflux disease without esophagitis: Secondary | ICD-10-CM

## 2012-05-17 DIAGNOSIS — R1031 Right lower quadrant pain: Secondary | ICD-10-CM | POA: Diagnosis not present

## 2012-05-17 DIAGNOSIS — R109 Unspecified abdominal pain: Secondary | ICD-10-CM

## 2012-05-17 DIAGNOSIS — G8929 Other chronic pain: Secondary | ICD-10-CM | POA: Diagnosis not present

## 2012-05-17 DIAGNOSIS — R634 Abnormal weight loss: Secondary | ICD-10-CM | POA: Diagnosis not present

## 2012-05-17 NOTE — Progress Notes (Signed)
Primary Care Physician:  Juliette Alcide, MD Primary Gastroenterologist:  Dr. Jena Gauss  Pre-Procedure History & Physical: HPI:  Alexandria Jordan is a 79 y.o. female here for evaluation of one-year history of intermittent bilateral lower quadrant abdominal pain. Some abdominal bloating as well. Does have bowel movements frequently but denies diarrhea; doesn't feel she evacuated some very well. No rectal bleeding. Colonoscopy last demonstrated colonic adenoma/ due for surveillance colonoscopy 2018. She also diverticulosis. Patient denies fevers or chills. No early satiety. Has lost 5 pounds since she was last here. Nexium controlling her reflux symptoms very well.  Past Medical History  Diagnosis Date  . GERD (gastroesophageal reflux disease)   . Hx of adenomatous colonic polyps   . TIA (transient ischemic attack)   . Hypothyroidism   . Adenoma 08/19/2006    tubular adenoma  . Diverticulosis   . Hemorrhoids     Past Surgical History  Procedure Laterality Date  . Colonoscopy  07/2006    tubular adenoma, , left-sided diverticula  . Esophagogastroduodenoscopy  01/2002    schatzki ring (not manipulated), small to moderate hh  . Hysterectomy for endometriosis and fibroids  1978  . Bladder tack  2001  . Colonoscopy  06/17/2011    Dr. Jena Gauss- colonic diverticulosis, colonic polyps-tubular adenoma    Prior to Admission medications   Medication Sig Start Date End Date Taking? Authorizing Provider  AGGRENOX 25-200 MG per 12 hr capsule Take 1 capsule by mouth 2 (two) times daily.  05/13/11  Yes Historical Provider, MD  albuterol (PROVENTIL HFA;VENTOLIN HFA) 108 (90 BASE) MCG/ACT inhaler Inhale 2 puffs into the lungs every 6 (six) hours as needed. For shortness of breath   Yes Historical Provider, MD  Azelaic Acid (FINACEA) 15 % cream Apply 1 application topically 2 (two) times daily. After skin is thoroughly washed and patted dry, gently but thoroughly massage a thin film of azelaic acid cream into the  affected area twice daily, in the morning and evening.   Yes Historical Provider, MD  Calcium Carbonate-Vit D-Min (CALTRATE 600+D PLUS) 600-400 MG-UNIT per tablet Take 1 tablet by mouth every morning.   Yes Historical Provider, MD  DETROL LA 4 MG 24 hr capsule Take 4 mg by mouth every morning.  05/13/11  Yes Historical Provider, MD  esomeprazole (NEXIUM) 40 MG capsule Take 1 capsule (40 mg total) by mouth daily before breakfast. 07/03/11  Yes Tiffany Kocher, PA-C  fluticasone (FLONASE) 50 MCG/ACT nasal spray Place 2 sprays into the nose at bedtime.   Yes Historical Provider, MD  folic acid (FOLVITE) 1 MG tablet Take 1 mg by mouth every morning.    Yes Historical Provider, MD  Sulfacetamide Sodium-Sulfur 10-2 % CREA Apply 1 application topically every morning.   Yes Historical Provider, MD  SYNTHROID 125 MCG tablet Take 125 mcg by mouth daily before breakfast.  05/12/12  Yes Historical Provider, MD  Thiamine HCl (VITAMIN B-1) 100 MG tablet Take 100 mg by mouth every morning.    Yes Historical Provider, MD  vitamin B-12 (CYANOCOBALAMIN) 1000 MCG tablet Take 1,000 mcg by mouth every morning.    Yes Historical Provider, MD  peg 3350 powder (MOVIPREP) 100 G SOLR Take 1 kit (100 g total) by mouth once. As directed Please purchase 1 Fleets enema to use with the prep 05/29/11   Corbin Ade, MD  ZOSTAVAX 62130 UNT/0.65ML injection  05/18/11   Historical Provider, MD    Allergies as of 05/17/2012 - Review Complete 05/17/2012  Allergen Reaction  Noted  . Adhesive (tape) Rash 06/15/2011  . Fulvicin u-f (griseofulvin) Rash 05/22/2010    No family history on file.  History   Social History  . Marital Status: Widowed    Spouse Name: N/A    Number of Children: N/A  . Years of Education: N/A   Occupational History  . Not on file.   Social History Main Topics  . Smoking status: Never Smoker   . Smokeless tobacco: Not on file  . Alcohol Use: No  . Drug Use: No  . Sexually Active: Not on file   Other  Topics Concern  . Not on file   Social History Narrative  . No narrative on file    Review of Systems: See HPI, otherwise negative ROS  Physical Exam: BP 141/89  Pulse 87  Temp(Src) 98.4 F (36.9 C) (Oral)  Ht 5\' 7"  (1.702 m)  Wt 191 lb 6.4 oz (86.818 kg)  BMI 29.97 kg/m2 General:   Alert,  Well-developed, well-nourished, pleasant and cooperative in NAD Skin:  Intact without significant lesions or rashes. Eyes:  Sclera clear, no icterus.   Conjunctiva pink. Ears:  Normal auditory acuity. Nose:  No deformity, discharge,  or lesions. Mouth:  No deformity or lesions. Neck:  Supple; no masses or thyromegaly. No significant cervical adenopathy. Lungs:  Clear throughout to auscultation.   No wheezes, crackles, or rhonchi. No acute distress. Heart:  Regular rate and rhythm; no murmurs, clicks, rubs,  or gallops. Abdomen: Full. Positive bowel sounds. No bruits. Abdomen is soft with the periumbilical tenderness and some vague bilateral lower quadrant tenderness. No Appreciable mass or rebound tenderness. No organomegaly.  Pulses:  Normal pulses noted. Extremities:  Without clubbing or edema. Rectal : Good sphincter tone. No mass the rectal vault. Minimal stool in rectal vault.     Impression/Plan:  Elderly lady with nonspecific abdominal pain as described above. There may be an element of constipation at play here. I certainly do not detect an imminent surgical process. Differential diagnosis is somewhat broad. GERD symptoms well controlled on Nexium.  Recommendations: We'll proceed with abdominal and pelvic contrast CT in the near future. We'll also have her return a stool sample for occult blood testing. Further recommendations once of the above studies available for review. She is to continue Nexium 40 mg daily for reflux. I feel benefits outweigh the risks.

## 2012-05-17 NOTE — Patient Instructions (Addendum)
CT of abdomen and pelvis with IV and oral contrast - lower abdominal pain  Continue Nexium daily

## 2012-05-18 LAB — CREATININE, SERUM: Creat: 0.78 mg/dL (ref 0.50–1.10)

## 2012-05-19 ENCOUNTER — Ambulatory Visit (HOSPITAL_COMMUNITY)
Admission: RE | Admit: 2012-05-19 | Discharge: 2012-05-19 | Disposition: A | Discharge status: Home / Self Care | Payer: Medicare Other | Source: Ambulatory Visit | Attending: Internal Medicine | Admitting: Internal Medicine

## 2012-05-19 DIAGNOSIS — K5730 Diverticulosis of large intestine without perforation or abscess without bleeding: Secondary | ICD-10-CM | POA: Insufficient documentation

## 2012-05-19 DIAGNOSIS — K449 Diaphragmatic hernia without obstruction or gangrene: Secondary | ICD-10-CM | POA: Insufficient documentation

## 2012-05-19 DIAGNOSIS — R109 Unspecified abdominal pain: Secondary | ICD-10-CM | POA: Diagnosis not present

## 2012-05-19 MED ORDER — IOHEXOL 300 MG/ML  SOLN
100.0000 mL | Freq: Once | INTRAMUSCULAR | Status: AC | PRN
  Administered 2012-05-19: 100 mL via INTRAVENOUS

## 2012-05-24 ENCOUNTER — Ambulatory Visit: Payer: Medicare Other | Admitting: Internal Medicine

## 2012-05-25 ENCOUNTER — Telehealth: Payer: Self-pay | Admitting: Internal Medicine

## 2012-05-25 NOTE — Telephone Encounter (Signed)
Referral has been faxed to Dr. Arbie Cookey and his office will contact Mrs. Bonczek to schedule date & time

## 2012-05-25 NOTE — Telephone Encounter (Signed)
Patient is scheduled on Tuesday 05/27 at 1:00

## 2012-05-25 NOTE — Telephone Encounter (Signed)
Message copied by Glendora Score on Wed May 25, 2012  1:28 PM ------      Message from: Myra Rude      Created: Tue May 24, 2012  9:45 AM       Benedetto Goad, this pt needs referral to vascular surgeon in [place]. RMR recommended Dr. Arbie Cookey. Please see pts CT report.  ------

## 2012-06-16 ENCOUNTER — Encounter: Payer: Self-pay | Admitting: Vascular Surgery

## 2012-06-17 ENCOUNTER — Ambulatory Visit (INDEPENDENT_AMBULATORY_CARE_PROVIDER_SITE_OTHER): Payer: Medicare Other | Admitting: Gastroenterology

## 2012-06-17 ENCOUNTER — Encounter: Payer: Self-pay | Admitting: Vascular Surgery

## 2012-06-17 ENCOUNTER — Ambulatory Visit (INDEPENDENT_AMBULATORY_CARE_PROVIDER_SITE_OTHER): Payer: Medicare Other | Admitting: Vascular Surgery

## 2012-06-17 VITALS — BP 154/78 | HR 81 | Ht 67.0 in | Wt 192.7 lb

## 2012-06-17 DIAGNOSIS — I739 Peripheral vascular disease, unspecified: Secondary | ICD-10-CM | POA: Diagnosis not present

## 2012-06-17 DIAGNOSIS — R109 Unspecified abdominal pain: Secondary | ICD-10-CM

## 2012-06-17 DIAGNOSIS — I7100 Dissection of unspecified site of aorta: Secondary | ICD-10-CM | POA: Insufficient documentation

## 2012-06-17 LAB — IFOBT (OCCULT BLOOD): IFOBT: NEGATIVE

## 2012-06-17 NOTE — Progress Notes (Signed)
Subjective:     Patient ID: Alexandria Jordan, female   DOB: [birth year], 79 y.o.   MRN: [id number]  HPI this 79 year old female was referred by Dr. Matthew Folks for evaluation of her abdominal aorta. Recently the patient had an abdominal CT scan performed because of some GI symptoms. 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 The patient denies any history of claudication any of the buttocks thighs or calves. She limits her walking because of an unsteady gait could not because of discomfort in her legs. She has no history of infection or cellulitis or gangrene in the toes. Denies rest pain or numbness in the feet. She has no history of abdominal aortic aneurysm.  Past Medical History  Diagnosis Date  . GERD (gastroesophageal reflux disease)   . Hx of adenomatous colonic polyps   . TIA (transient ischemic attack)   . Hypothyroidism   . Adenoma 08/19/2006    tubular adenoma  . Diverticulosis   . Hemorrhoids   . Stroke     History  Substance Use Topics  . Smoking status: Never Smoker   . Smokeless tobacco: Never Used  . Alcohol Use: No    Family History  Problem Relation Age of Onset  . Diabetes Mother   . Other Mother     varicose veins    Allergies  Allergen Reactions  . Adhesive (Tape) Rash  . Fulvicin U-F (Griseofulvin) Rash    Current outpatient prescriptions:Calcium Carbonate-Vit D-Min (CALTRATE 600+D PLUS) 600-400 MG-UNIT per tablet, Take 1 tablet by mouth every morning., Disp: , Rfl: ;  calcium-vitamin D (OSCAL WITH D) 500-200 MG-UNIT per tablet, Take 1 tablet by mouth daily., Disp: , Rfl: ;  clopidogrel (PLAVIX) 75 MG tablet, Take 1 tablet by mouth daily., Disp: , Rfl: ;  folic acid (FOLVITE) 1 MG tablet, Take 1 mg by mouth every morning. , Disp: , Rfl:  pantoprazole (PROTONIX) 40 MG tablet, Take 1 tablet by mouth daily., Disp: , Rfl: ;  SYNTHROID 125 MCG tablet, Take 125 mcg by mouth daily before breakfast. , Disp: , Rfl:  ;  Thiamine HCl (VITAMIN B-1) 100 MG tablet, Take 100 mg by mouth every morning. , Disp: , Rfl: ;  vitamin B-12 (CYANOCOBALAMIN) 1000 MCG tablet, Take 1,000 mcg by mouth every morning. , Disp: , Rfl:  AGGRENOX 25-200 MG per 12 hr capsule, Take 1 capsule by mouth 2 (two) times daily. , Disp: , Rfl: ;  albuterol (PROVENTIL HFA;VENTOLIN HFA) 108 (90 BASE) MCG/ACT inhaler, Inhale 2 puffs into the lungs every 6 (six) hours as needed. For shortness of breath, Disp: , Rfl:  Azelaic Acid (FINACEA) 15 % cream, Apply 1 application topically 2 (two) times daily. After skin is thoroughly washed and patted dry, gently but thoroughly massage a thin film of azelaic acid cream into the affected area twice daily, in the morning and evening., Disp: , Rfl: ;  DETROL LA 4 MG 24 hr capsule, Take 4 mg by mouth every morning. , Disp: , Rfl:  esomeprazole (NEXIUM) 40 MG capsule, Take 1 capsule (40 mg total) by mouth daily before breakfast., Disp: 90 capsule, Rfl: 3;  fluticasone (FLONASE) 50 MCG/ACT nasal spray, Place 2 sprays into the nose at bedtime., Disp: , Rfl: ;  peg 3350 powder (MOVIPREP) 100 G SOLR, Take 1 kit (100 g total) by mouth once. As directed Please purchase 1 Fleets enema to use with the prep, Disp: 1 kit, Rfl: 0 Sulfacetamide  Sodium-Sulfur 10-2 % CREA, Apply 1 application topically every morning., Disp: , Rfl: ;  ZOSTAVAX 16109 UNT/0.65ML injection, , Disp: , Rfl:   BP 154/78  Pulse 81  Ht 5\' 7"  (1.702 m)  Wt 192 lb 11.2 oz (87.408 kg)  BMI 30.17 kg/m2  SpO2 99%  Body mass index is 30.17 kg/(m^2).          Review of Systems denies chest pain but does have dyspnea on exertion. Complains of unsteady gait. Denies hemoptysis, orthopnea, productive cough, or other symptoms in a complete review of systems    Objective:   Physical Exam blood pressure 124/78 heart rate 81 respirations 16 Gen.-alert and oriented x3 in no apparent distress HEENT normal for age Lungs no rhonchi or  wheezing Cardiovascular regular rhythm no murmurs carotid pulses 3+ palpable no bruits audible Abdomen soft nontender no palpable masses Musculoskeletal free of  major deformities Skin clear -no rashes Neurologic normal Lower extremities 3+ femoral and dorsalis pedis and posterior tibial pulses palpable bilaterally with no edema No evidence of ischemia, gangrene, or infection and or treatment he  Today I reviewed the CT scan by computer. The aortoiliac system is widely patent. There is some plaque formation in the infrarenal aorta and some mild irregularity but no aneurysmal dilatation or significant stenosis by CT scan      Assessment:     Atheromatous plaquing infrarenal aorta-not flow-limiting and causing any symptomatology-no evidence of aortic aneurysm    Plan:     Discuss this at length with the patient. No followup is needed unless patient develops claudication type symptoms which she is currently not experiencing  Return on when necessary basis

## 2012-06-21 ENCOUNTER — Encounter: Payer: Medicare Other | Admitting: Vascular Surgery

## 2012-06-27 NOTE — Patient Instructions (Addendum)
Routing to Dr. Rourk  

## 2012-06-28 DIAGNOSIS — L719 Rosacea, unspecified: Secondary | ICD-10-CM | POA: Diagnosis not present

## 2012-06-28 DIAGNOSIS — G459 Transient cerebral ischemic attack, unspecified: Secondary | ICD-10-CM | POA: Diagnosis not present

## 2012-06-28 DIAGNOSIS — E039 Hypothyroidism, unspecified: Secondary | ICD-10-CM | POA: Diagnosis not present

## 2012-06-28 DIAGNOSIS — R7309 Other abnormal glucose: Secondary | ICD-10-CM | POA: Diagnosis not present

## 2012-06-28 DIAGNOSIS — K219 Gastro-esophageal reflux disease without esophagitis: Secondary | ICD-10-CM | POA: Diagnosis not present

## 2012-06-30 DIAGNOSIS — E039 Hypothyroidism, unspecified: Secondary | ICD-10-CM | POA: Diagnosis not present

## 2012-07-01 ENCOUNTER — Encounter: Payer: Self-pay | Admitting: General Practice

## 2012-07-07 DIAGNOSIS — L719 Rosacea, unspecified: Secondary | ICD-10-CM | POA: Diagnosis not present

## 2012-07-07 DIAGNOSIS — E039 Hypothyroidism, unspecified: Secondary | ICD-10-CM | POA: Diagnosis not present

## 2012-07-07 DIAGNOSIS — G459 Transient cerebral ischemic attack, unspecified: Secondary | ICD-10-CM | POA: Diagnosis not present

## 2012-07-07 DIAGNOSIS — K219 Gastro-esophageal reflux disease without esophagitis: Secondary | ICD-10-CM | POA: Diagnosis not present

## 2012-07-07 DIAGNOSIS — R7309 Other abnormal glucose: Secondary | ICD-10-CM | POA: Diagnosis not present

## 2012-07-07 DIAGNOSIS — I7100 Dissection of unspecified site of aorta: Secondary | ICD-10-CM | POA: Diagnosis not present

## 2012-08-01 DIAGNOSIS — Z1231 Encounter for screening mammogram for malignant neoplasm of breast: Secondary | ICD-10-CM | POA: Diagnosis not present

## 2012-08-01 DIAGNOSIS — Z01419 Encounter for gynecological examination (general) (routine) without abnormal findings: Secondary | ICD-10-CM | POA: Diagnosis not present

## 2012-08-01 DIAGNOSIS — L280 Lichen simplex chronicus: Secondary | ICD-10-CM | POA: Diagnosis not present

## 2012-08-01 DIAGNOSIS — N952 Postmenopausal atrophic vaginitis: Secondary | ICD-10-CM | POA: Diagnosis not present

## 2012-08-01 DIAGNOSIS — N318 Other neuromuscular dysfunction of bladder: Secondary | ICD-10-CM | POA: Diagnosis not present

## 2012-08-01 DIAGNOSIS — N951 Menopausal and female climacteric states: Secondary | ICD-10-CM | POA: Diagnosis not present

## 2012-08-23 ENCOUNTER — Ambulatory Visit (INDEPENDENT_AMBULATORY_CARE_PROVIDER_SITE_OTHER): Payer: Medicare Other | Admitting: Internal Medicine

## 2012-08-23 ENCOUNTER — Encounter: Payer: Self-pay | Admitting: Internal Medicine

## 2012-08-23 VITALS — BP 144/82 | HR 70 | Temp 98.1°F | Ht 67.0 in | Wt 192.0 lb

## 2012-08-23 DIAGNOSIS — K219 Gastro-esophageal reflux disease without esophagitis: Secondary | ICD-10-CM

## 2012-08-23 DIAGNOSIS — K5900 Constipation, unspecified: Secondary | ICD-10-CM | POA: Diagnosis not present

## 2012-08-23 NOTE — Patient Instructions (Signed)
Continue pantoprazole daily  Use Benafiber daily  Digestive Advantage for gas/bloat symptoms  Office visit in 6 months

## 2012-08-23 NOTE — Progress Notes (Signed)
Primary Care Physician:  Jobe Marker Primary Gastroenterologist:  Dr. Jena Gauss  Pre-Procedure History & Physical: HPI:  Alexandria Jordan is a 79 y.o. female here for followup of GERD, constipation and abdominal pain. Saw a vascular surgeon in [place] for her aortic dissection with ulceration. He recommended conservative management. She has been placed on Plavix by her PCP. Nexium switch to protonix. Also on oxybutynin for her bladder. Has enjoyed significant improvement in abdominal pain and bowel function with Benefiber daily. Does complain of increased malodorous gas and bloating/flatulence from time to time recently.  Past Medical History  Diagnosis Date  . GERD (gastroesophageal reflux disease)   . Hx of adenomatous colonic polyps   . TIA (transient ischemic attack)   . Hypothyroidism   . Adenoma 08/19/2006    tubular adenoma  . Diverticulosis   . Hemorrhoids   . Stroke     Past Surgical History  Procedure Laterality Date  . Colonoscopy  07/2006    tubular adenoma, , left-sided diverticula  . Esophagogastroduodenoscopy  01/2002    schatzki ring (not manipulated), small to moderate hh  . Hysterectomy for endometriosis and fibroids  1978  . Bladder tack  2001  . Colonoscopy  06/17/2011    Dr. Jena Gauss- colonic diverticulosis, colonic polyps-tubular adenoma  . Abdominal hysterectomy    . Cesarean section      Prior to Admission medications   Medication Sig Start Date End Date Taking? Authorizing Provider  AGGRENOX 25-200 MG per 12 hr capsule Take 1 capsule by mouth 2 (two) times daily.  05/13/11   Historical Provider, MD  albuterol (PROVENTIL HFA;VENTOLIN HFA) 108 (90 BASE) MCG/ACT inhaler Inhale 2 puffs into the lungs every 6 (six) hours as needed. For shortness of breath    Historical Provider, MD  Azelaic Acid (FINACEA) 15 % cream Apply 1 application topically 2 (two) times daily. After skin is thoroughly washed and patted dry, gently but thoroughly massage a thin film of  azelaic acid cream into the affected area twice daily, in the morning and evening.    Historical Provider, MD  Calcium Carbonate-Vit D-Min (CALTRATE 600+D PLUS) 600-400 MG-UNIT per tablet Take 1 tablet by mouth every morning.    Historical Provider, MD  calcium-vitamin D (OSCAL WITH D) 500-200 MG-UNIT per tablet Take 1 tablet by mouth daily.    Historical Provider, MD  clopidogrel (PLAVIX) 75 MG tablet Take 1 tablet by mouth daily. 05/18/12   Historical Provider, MD  DETROL LA 4 MG 24 hr capsule Take 4 mg by mouth every morning.  05/13/11   Historical Provider, MD  esomeprazole (NEXIUM) 40 MG capsule Take 1 capsule (40 mg total) by mouth daily before breakfast. 07/03/11   Tiffany Kocher, PA-C  fluticasone [facility]) 50 MCG/ACT nasal spray Place 2 sprays into the nose at bedtime.    Historical Provider, MD  folic acid (FOLVITE) 1 MG tablet Take 1 mg by mouth every morning.     Historical Provider, MD  pantoprazole (PROTONIX) 40 MG tablet Take 1 tablet by mouth daily. 06/13/12   Historical Provider, MD  peg 3350 powder (MOVIPREP) 100 G SOLR Take 1 kit (100 g total) by mouth once. As directed Please purchase 1 Fleets enema to use with the prep 05/29/11   Corbin Ade, MD  Sulfacetamide Sodium-Sulfur 10-2 % CREA Apply 1 application topically every morning.    Historical Provider, MD  SYNTHROID 125 MCG tablet Take 125 mcg by mouth daily before breakfast.  05/12/12   Historical Provider,  MD  Thiamine HCl (VITAMIN B-1) 100 MG tablet Take 100 mg by mouth every morning.     Historical Provider, MD  vitamin B-12 (CYANOCOBALAMIN) 1000 MCG tablet Take 1,000 mcg by mouth every morning.     Historical Provider, MD  ZOSTAVAX 16109 UNT/0.65ML injection  05/18/11   Historical Provider, MD    Allergies as of 08/23/2012 - Review Complete 06/17/2012  Allergen Reaction Noted  . Adhesive (tape) Rash 06/15/2011  . Fulvicin u-f (griseofulvin) Rash 05/22/2010    Family History  Problem Relation Age of Onset  . Diabetes  Mother   . Other Mother     varicose veins    History   Social History  . Marital Status: Widowed    Spouse Name: N/A    Number of Children: N/A  . Years of Education: N/A   Occupational History  . Not on file.   Social History Main Topics  . Smoking status: Never Smoker   . Smokeless tobacco: Never Used  . Alcohol Use: No  . Drug Use: No  . Sexually Active: Not on file   Other Topics Concern  . Not on file   Social History Narrative  . No narrative on file    Review of Systems: See HPI, otherwise negative ROS  Physical Exam: There were no vitals taken for this visit. General:   Alert,  Well-developed, well-nourished, pleasant and cooperative in NAD Skin:  Intact without significant lesions or rashes. Eyes:  Sclera clear, no icterus.   Conjunctiva pink. Ears:  Normal auditory acuity. Nose:  No deformity, discharge,  or lesions. Mouth:  No deformity or lesions. Neck:  Supple; no masses or thyromegaly. No significant cervical adenopathy. Lungs:  Clear throughout to auscultation.   No wheezes, crackles, or rhonchi. No acute distress. Heart:  Regular rate and rhythm; no murmurs, clicks, rubs,  or gallops. Abdomen: Non-distended, normal bowel sounds.  Soft and nontender without appreciable mass or hepatosplenomegaly.  Pulses:  Normal pulses noted. Extremities:  Without clubbing or edema.  Impression/Plan:  79 year old lady with GERD well-controlled on Protonix. Chronic constipation managed better now with daily Benefiber. Gas/bloating flatulence recently.   Recommendations:  Continue Benefiber daily. Continue Protonix. Add digest advantage for gas bloat symptoms daily. Office visit here in 6 months and when necessary.

## 2012-08-31 ENCOUNTER — Other Ambulatory Visit: Payer: Self-pay

## 2012-09-13 DIAGNOSIS — D485 Neoplasm of uncertain behavior of skin: Secondary | ICD-10-CM | POA: Diagnosis not present

## 2012-09-13 DIAGNOSIS — L719 Rosacea, unspecified: Secondary | ICD-10-CM | POA: Diagnosis not present

## 2012-09-13 DIAGNOSIS — L299 Pruritus, unspecified: Secondary | ICD-10-CM | POA: Diagnosis not present

## 2012-10-20 DIAGNOSIS — C4492 Squamous cell carcinoma of skin, unspecified: Secondary | ICD-10-CM | POA: Diagnosis not present

## 2012-11-02 DIAGNOSIS — Z5189 Encounter for other specified aftercare: Secondary | ICD-10-CM | POA: Diagnosis not present

## 2012-12-01 ENCOUNTER — Other Ambulatory Visit: Payer: Self-pay

## 2012-12-05 DIAGNOSIS — Z5189 Encounter for other specified aftercare: Secondary | ICD-10-CM | POA: Diagnosis not present

## 2012-12-31 DIAGNOSIS — J189 Pneumonia, unspecified organism: Secondary | ICD-10-CM | POA: Diagnosis not present

## 2012-12-31 DIAGNOSIS — J019 Acute sinusitis, unspecified: Secondary | ICD-10-CM | POA: Diagnosis not present

## 2013-01-02 DIAGNOSIS — J189 Pneumonia, unspecified organism: Secondary | ICD-10-CM | POA: Diagnosis not present

## 2013-01-02 DIAGNOSIS — J019 Acute sinusitis, unspecified: Secondary | ICD-10-CM | POA: Diagnosis not present

## 2013-02-07 ENCOUNTER — Encounter: Payer: Self-pay | Admitting: Internal Medicine

## 2013-02-07 ENCOUNTER — Ambulatory Visit (INDEPENDENT_AMBULATORY_CARE_PROVIDER_SITE_OTHER): Payer: Medicare Other | Admitting: Internal Medicine

## 2013-02-07 VITALS — BP 153/89 | HR 91 | Temp 97.9°F | Wt 191.4 lb

## 2013-02-07 DIAGNOSIS — D126 Benign neoplasm of colon, unspecified: Secondary | ICD-10-CM

## 2013-02-07 DIAGNOSIS — K219 Gastro-esophageal reflux disease without esophagitis: Secondary | ICD-10-CM

## 2013-02-07 NOTE — Progress Notes (Signed)
Primary Care Physician:  Tonye Becket Primary Gastroenterologist:  Dr. Gala Romney  Pre-Procedure History & Physical: HPI:  Alexandria Jordan is a 80 y.o. female here for GERD/gas bloat. Doing very well on generic pantoprazole. Takes Benefiber sporadically along with the digestive advantage. Gas bloat/constipation symptoms much better overall. Conservative treatment recommended for aortic dissection. History of colonic adenoma due for surveillance colonoscopy 2018.  Past Medical History  Diagnosis Date  . GERD (gastroesophageal reflux disease)   . Hx of adenomatous colonic polyps   . TIA (transient ischemic attack)   . Hypothyroidism   . Adenoma 08/19/2006    tubular adenoma  . Diverticulosis   . Hemorrhoids   . Stroke     Past Surgical History  Procedure Laterality Date  . Colonoscopy  07/2006    tubular adenoma, , left-sided diverticula  . Esophagogastroduodenoscopy  01/2002    schatzki ring (not manipulated), small to moderate hh  . Hysterectomy for endometriosis and fibroids  1978  . Bladder tack  2001  . Colonoscopy  06/17/2011    Dr. Gala Romney- colonic diverticulosis, colonic polyps-tubular adenoma  . Abdominal hysterectomy    . Cesarean section      Prior to Admission medications   Medication Sig Start Date End Date Taking? Authorizing Provider  albuterol (PROVENTIL HFA;VENTOLIN HFA) 108 (90 BASE) MCG/ACT inhaler Inhale 2 puffs into the lungs every 6 (six) hours as needed. For shortness of breath   Yes Historical Provider, MD  Azelaic Acid (FINACEA) 15 % cream Apply 1 application topically 2 (two) times daily. After skin is thoroughly washed and patted dry, gently but thoroughly massage a thin film of azelaic acid cream into the affected area twice daily, in the morning and evening.   Yes Historical Provider, MD  Calcium Carbonate-Vit D-Min (CALTRATE 600+D PLUS) 600-400 MG-UNIT per tablet Take 1 tablet by mouth every morning.   Yes Historical Provider, MD  cholecalciferol (VITAMIN D)  1000 UNITS tablet Take 1,000 Units by mouth daily.   Yes Historical Provider, MD  clopidogrel (PLAVIX) 75 MG tablet Take 1 tablet by mouth daily. 05/18/12  Yes Historical Provider, MD  DETROL LA 4 MG 24 hr capsule Take 4 mg by mouth every morning.  05/13/11  Yes Historical Provider, MD  fluticasone (FLONASE) 50 MCG/ACT nasal spray Place 2 sprays into the nose at bedtime.   Yes Historical Provider, MD  folic acid (FOLVITE) 1 MG tablet Take 1 mg by mouth every morning.    Yes Historical Provider, MD  oxybutynin (DITROPAN-XL) 10 MG 24 hr tablet Take 10 mg by mouth daily.  08/10/12  Yes Historical Provider, MD  pantoprazole (PROTONIX) 40 MG tablet Take 1 tablet by mouth daily. 06/13/12  Yes Historical Provider, MD  Sulfacetamide Sodium-Sulfur 10-2 % CREA Apply 1 application topically every morning.   Yes Historical Provider, MD  SYNTHROID 125 MCG tablet Take 125 mcg by mouth daily before breakfast.  05/12/12  Yes Historical Provider, MD  Thiamine HCl (VITAMIN B-1) 100 MG tablet Take 100 mg by mouth every morning.    Yes Historical Provider, MD  triamcinolone cream (KENALOG) 0.1 % Apply 1 application topically.  07/07/12  Yes Historical Provider, MD  vitamin B-12 (CYANOCOBALAMIN) 1000 MCG tablet Take 1,000 mcg by mouth every morning.    Yes Historical Provider, MD    Allergies as of 02/07/2013 - Review Complete 02/07/2013  Allergen Reaction Noted  . Adhesive [tape] Rash 06/15/2011  . Fulvicin u-f [griseofulvin] Rash 05/22/2010    Family History  Problem Relation  Age of Onset  . Diabetes Mother   . Other Mother     varicose veins    History   Social History  . Marital Status: Widowed    Spouse Name: N/A    Number of Children: N/A  . Years of Education: N/A   Occupational History  . Not on file.   Social History Main Topics  . Smoking status: Never Smoker   . Smokeless tobacco: Never Used  . Alcohol Use: No  . Drug Use: No  . Sexual Activity: Not on file   Other Topics Concern  . Not  on file   Social History Narrative  . No narrative on file    Review of Systems: See HPI, otherwise negative ROS  Physical Exam: BP 153/89  Pulse 91  Temp(Src) 97.9 F (36.6 C) (Oral)  Wt 191 lb 6.4 oz (86.818 kg) General:   Alert,  Well-developed, well-nourished, pleasant and cooperative in NAD Skin:  Intact without significant lesions or rashes. Eyes:  Sclera clear, no icterus.   Conjunctiva pink. Ears:  Normal auditory acuity. Nose:  No deformity, discharge,  or lesions. Mouth:  No deformity or lesions. Neck:  Supple; no masses or thyromegaly. No significant cervical adenopathy. Lungs:  Clear throughout to auscultation.   No wheezes, crackles, or rhonchi. No acute distress. Heart:  Regular rate and rhythm; no murmurs, clicks, rubs,  or gallops. Abdomen: Non-distended, normal bowel sounds.  No bruits. Soft and nontender without appreciable mass or hepatosplenomegaly.  Pulses:  Normal pulses noted. Extremities:  Without clubbing or edema.  Impression:   GERD controlled well on protonix 40 mg daily. Gas bloat / constipation   -  improvemed taking Benefiber and digestive advantage somewhat sporadically. History of colonic adenoma-due for surveillance exam 2018-patient would be about 80 years old at that time.   Recommendations:  Continue protonix daily  Continue Benefiber daily  Continue to use Digestive Advantage  Office visit here in 6 months

## 2013-02-07 NOTE — Patient Instructions (Signed)
Continue protonix daily  Continue Benefiber daily  Continue to use Digestive Advantage  Office visit here in 6 months

## 2013-05-01 DIAGNOSIS — L719 Rosacea, unspecified: Secondary | ICD-10-CM | POA: Diagnosis not present

## 2013-05-01 DIAGNOSIS — Z85828 Personal history of other malignant neoplasm of skin: Secondary | ICD-10-CM | POA: Diagnosis not present

## 2013-05-01 DIAGNOSIS — D1800 Hemangioma unspecified site: Secondary | ICD-10-CM | POA: Diagnosis not present

## 2013-05-01 DIAGNOSIS — D485 Neoplasm of uncertain behavior of skin: Secondary | ICD-10-CM | POA: Diagnosis not present

## 2013-05-30 DIAGNOSIS — J029 Acute pharyngitis, unspecified: Secondary | ICD-10-CM | POA: Diagnosis not present

## 2013-05-30 DIAGNOSIS — J069 Acute upper respiratory infection, unspecified: Secondary | ICD-10-CM | POA: Diagnosis not present

## 2013-06-19 DIAGNOSIS — R05 Cough: Secondary | ICD-10-CM | POA: Diagnosis not present

## 2013-06-19 DIAGNOSIS — J069 Acute upper respiratory infection, unspecified: Secondary | ICD-10-CM | POA: Diagnosis not present

## 2013-06-19 DIAGNOSIS — R059 Cough, unspecified: Secondary | ICD-10-CM | POA: Diagnosis not present

## 2013-07-04 DIAGNOSIS — K219 Gastro-esophageal reflux disease without esophagitis: Secondary | ICD-10-CM | POA: Diagnosis not present

## 2013-07-04 DIAGNOSIS — R7309 Other abnormal glucose: Secondary | ICD-10-CM | POA: Diagnosis not present

## 2013-07-04 DIAGNOSIS — E039 Hypothyroidism, unspecified: Secondary | ICD-10-CM | POA: Diagnosis not present

## 2013-07-04 DIAGNOSIS — G459 Transient cerebral ischemic attack, unspecified: Secondary | ICD-10-CM | POA: Diagnosis not present

## 2013-07-11 DIAGNOSIS — E039 Hypothyroidism, unspecified: Secondary | ICD-10-CM | POA: Diagnosis not present

## 2013-07-11 DIAGNOSIS — G459 Transient cerebral ischemic attack, unspecified: Secondary | ICD-10-CM | POA: Diagnosis not present

## 2013-07-11 DIAGNOSIS — R05 Cough: Secondary | ICD-10-CM | POA: Diagnosis not present

## 2013-07-11 DIAGNOSIS — M79609 Pain in unspecified limb: Secondary | ICD-10-CM | POA: Diagnosis not present

## 2013-07-11 DIAGNOSIS — L719 Rosacea, unspecified: Secondary | ICD-10-CM | POA: Diagnosis not present

## 2013-07-11 DIAGNOSIS — R059 Cough, unspecified: Secondary | ICD-10-CM | POA: Diagnosis not present

## 2013-07-11 DIAGNOSIS — I7100 Dissection of unspecified site of aorta: Secondary | ICD-10-CM | POA: Diagnosis not present

## 2013-07-11 DIAGNOSIS — R7309 Other abnormal glucose: Secondary | ICD-10-CM | POA: Diagnosis not present

## 2013-07-11 DIAGNOSIS — K219 Gastro-esophageal reflux disease without esophagitis: Secondary | ICD-10-CM | POA: Diagnosis not present

## 2013-08-23 ENCOUNTER — Encounter: Payer: Self-pay | Admitting: Internal Medicine

## 2013-09-26 DIAGNOSIS — L259 Unspecified contact dermatitis, unspecified cause: Secondary | ICD-10-CM | POA: Diagnosis not present

## 2013-09-26 DIAGNOSIS — L719 Rosacea, unspecified: Secondary | ICD-10-CM | POA: Diagnosis not present

## 2013-09-26 DIAGNOSIS — Z85828 Personal history of other malignant neoplasm of skin: Secondary | ICD-10-CM | POA: Diagnosis not present

## 2013-09-26 DIAGNOSIS — L570 Actinic keratosis: Secondary | ICD-10-CM | POA: Diagnosis not present

## 2013-10-06 ENCOUNTER — Ambulatory Visit (INDEPENDENT_AMBULATORY_CARE_PROVIDER_SITE_OTHER): Payer: Medicare Other | Admitting: Internal Medicine

## 2013-10-06 ENCOUNTER — Encounter: Payer: Self-pay | Admitting: Internal Medicine

## 2013-10-06 VITALS — BP 143/81 | HR 81 | Temp 97.4°F | Ht 67.0 in | Wt 191.2 lb

## 2013-10-06 DIAGNOSIS — Z860100 Personal history of colon polyps, unspecified: Secondary | ICD-10-CM

## 2013-10-06 DIAGNOSIS — K219 Gastro-esophageal reflux disease without esophagitis: Secondary | ICD-10-CM

## 2013-10-06 NOTE — Progress Notes (Signed)
Primary Care Physician:  Tonye Becket Primary Gastroenterologist:  Dr. Gala Romney  Pre-Procedure History & Physical: HPI:  Alexandria Jordan is a 80 y.o. female here for GERD and gas bloat; history of colonic adenoma. Taking Benefiber only once daily. No dysphagia. No rectal bleeding. Single colonic adenoma removed 2013. Overall, doing well from a GI standpoint.  Past Medical History  Diagnosis Date  . GERD (gastroesophageal reflux disease)   . Hx of adenomatous colonic polyps   . TIA (transient ischemic attack)   . Hypothyroidism   . Adenoma 08/19/2006    tubular adenoma  . Diverticulosis   . Hemorrhoids   . Stroke     Past Surgical History  Procedure Laterality Date  . Colonoscopy  07/2006    tubular adenoma, , left-sided diverticula  . Esophagogastroduodenoscopy  01/2002    schatzki ring (not manipulated), small to moderate hh  . Hysterectomy for endometriosis and fibroids  1978  . Bladder tack  2001  . Colonoscopy  06/17/2011    Dr. Gala Romney- colonic diverticulosis, colonic polyps-tubular adenoma  . Abdominal hysterectomy    . Cesarean section      Prior to Admission medications   Medication Sig Start Date End Date Taking? Authorizing Provider  albuterol (PROVENTIL HFA;VENTOLIN HFA) 108 (90 BASE) MCG/ACT inhaler Inhale 2 puffs into the lungs every 6 (six) hours as needed. For shortness of breath   Yes Historical Provider, MD  Azelaic Acid (FINACEA) 15 % cream Apply 1 application topically 2 (two) times daily. After skin is thoroughly washed and patted dry, gently but thoroughly massage a thin film of azelaic acid cream into the affected area twice daily, in the morning and evening.   Yes Historical Provider, MD  Calcium Carbonate-Vit D-Min (CALTRATE 600+D PLUS) 600-400 MG-UNIT per tablet Take 1 tablet by mouth every morning.   Yes Historical Provider, MD  cholecalciferol (VITAMIN D) 1000 UNITS tablet Take 1,000 Units by mouth daily.   Yes Historical Provider, MD  clobetasol  cream (TEMOVATE) 0.05 %  10/04/13  Yes Historical Provider, MD  clopidogrel (PLAVIX) 75 MG tablet Take 1 tablet by mouth daily. 05/18/12  Yes Historical Provider, MD  DETROL LA 4 MG 24 hr capsule Take 4 mg by mouth every morning.  05/13/11  Yes Historical Provider, MD  Digestive Enzymes (DIGESTIVE SUPPORT PO) Take by mouth daily.   Yes Historical Provider, MD  fluticasone (FLONASE) 50 MCG/ACT nasal spray Place 2 sprays into the nose at bedtime.   Yes Historical Provider, MD  folic acid (FOLVITE) 1 MG tablet Take 800 mg by mouth every morning.    Yes Historical Provider, MD  gabapentin (NEURONTIN) 100 MG capsule Take 100 mg by mouth at bedtime.   Yes Historical Provider, MD  metroNIDAZOLE (METROCREAM) 0.75 % cream  10/04/13  Yes Historical Provider, MD  pantoprazole (PROTONIX) 40 MG tablet Take 1 tablet by mouth daily. 06/13/12  Yes Historical Provider, MD  Sulfacetamide Sodium-Sulfur 10-2 % CREA Apply 1 application topically every morning.   Yes Historical Provider, MD  SYNTHROID 125 MCG tablet Take 125 mcg by mouth daily before breakfast.  05/12/12  Yes Historical Provider, MD  Thiamine HCl (VITAMIN B-1) 100 MG tablet Take 1,000 mg by mouth every morning.    Yes Historical Provider, MD  triamcinolone cream (KENALOG) 0.1 % Apply 1 application topically.  07/07/12  Yes Historical Provider, MD  vitamin B-12 (CYANOCOBALAMIN) 1000 MCG tablet Take 1,000 mcg by mouth every morning.    Yes Historical Provider, MD  oxybutynin (DITROPAN-XL) 10 MG 24 hr tablet Take 10 mg by mouth daily.  08/10/12   Historical Provider, MD    Allergies as of 10/06/2013 - Review Complete 10/06/2013  Allergen Reaction Noted  . Adhesive [tape] Rash 06/15/2011  . Fulvicin u-f [griseofulvin] Rash 05/22/2010    Family History  Problem Relation Age of Onset  . Diabetes Mother   . Other Mother     varicose veins    History   Social History  . Marital Status: Widowed    Spouse Name: N/A    Number of Children: N/A  . Years of  Education: N/A   Occupational History  . Not on file.   Social History Main Topics  . Smoking status: Never Smoker   . Smokeless tobacco: Never Used  . Alcohol Use: No  . Drug Use: No  . Sexual Activity: Not on file   Other Topics Concern  . Not on file   Social History Narrative  . No narrative on file    Review of Systems: See HPI, otherwise negative ROS  Physical Exam: BP 143/81  Pulse 81  Temp(Src) 97.4 F (36.3 C) (Oral)  Ht 5\' 7"  (1.702 m)  Wt 191 lb 3.2 oz (86.728 kg)  BMI 29.94 kg/m2 General:   Alert,  Well-developed, well-nourished, pleasant and cooperative in NAD Skin:  Intact without significant lesions or rashes. Eyes:  Sclera clear, no icterus.   Conjunctiva pink. Ears:  Normal auditory acuity. Nose:  No deformity, discharge,  or lesions. Mouth:  No deformity or lesions. Neck:  Supple; no masses or thyromegaly. No significant cervical adenopathy. Lungs:  Clear throughout to auscultation.   No wheezes, crackles, or rhonchi. No acute distress. Heart:  Regular rate and rhythm; no murmurs, clicks, rubs,  or gallops. Abdomen: Non-distended, normal bowel sounds.  Soft and nontender without appreciable mass or hepatosplenomegaly.  Pulses:  Normal pulses noted. Extremities:  Without clubbing or edema.  Impression: Pleasant 80 year old lady with gas/ bloat symptoms well managed with digestive advantage and Benefiber once daily. GERD symptoms well controlled on Protonix.  Recommendations:   Continue twice a day [place]  Office visit in 1 year    Notice: This dictation was prepared with Dragon dictation along with smaller phrase technology. Any transcriptional errors that result from this process are unintentional and may not be corrected upon review.

## 2013-10-06 NOTE — Patient Instructions (Signed)
Continue twice a day [place]  Office visit in 1 year

## 2013-11-07 DIAGNOSIS — Z1231 Encounter for screening mammogram for malignant neoplasm of breast: Secondary | ICD-10-CM | POA: Diagnosis not present

## 2013-11-07 DIAGNOSIS — Z01419 Encounter for gynecological examination (general) (routine) without abnormal findings: Secondary | ICD-10-CM | POA: Diagnosis not present

## 2013-11-07 DIAGNOSIS — L309 Dermatitis, unspecified: Secondary | ICD-10-CM | POA: Diagnosis not present

## 2013-11-07 DIAGNOSIS — M810 Age-related osteoporosis without current pathological fracture: Secondary | ICD-10-CM | POA: Diagnosis not present

## 2013-11-07 DIAGNOSIS — N3281 Overactive bladder: Secondary | ICD-10-CM | POA: Diagnosis not present

## 2013-11-07 DIAGNOSIS — Z780 Asymptomatic menopausal state: Secondary | ICD-10-CM | POA: Diagnosis not present

## 2013-11-07 DIAGNOSIS — L280 Lichen simplex chronicus: Secondary | ICD-10-CM | POA: Diagnosis not present

## 2013-11-07 DIAGNOSIS — M8580 Other specified disorders of bone density and structure, unspecified site: Secondary | ICD-10-CM | POA: Diagnosis not present

## 2013-11-07 DIAGNOSIS — N952 Postmenopausal atrophic vaginitis: Secondary | ICD-10-CM | POA: Diagnosis not present

## 2013-11-07 DIAGNOSIS — N959 Unspecified menopausal and perimenopausal disorder: Secondary | ICD-10-CM | POA: Diagnosis not present

## 2013-11-07 DIAGNOSIS — Z9189 Other specified personal risk factors, not elsewhere classified: Secondary | ICD-10-CM | POA: Diagnosis not present

## 2013-11-10 ENCOUNTER — Other Ambulatory Visit: Payer: Self-pay

## 2013-11-17 DIAGNOSIS — Z23 Encounter for immunization: Secondary | ICD-10-CM | POA: Diagnosis not present

## 2013-12-04 DIAGNOSIS — G459 Transient cerebral ischemic attack, unspecified: Secondary | ICD-10-CM | POA: Diagnosis not present

## 2013-12-04 DIAGNOSIS — L719 Rosacea, unspecified: Secondary | ICD-10-CM | POA: Diagnosis not present

## 2013-12-04 DIAGNOSIS — E039 Hypothyroidism, unspecified: Secondary | ICD-10-CM | POA: Diagnosis not present

## 2013-12-04 DIAGNOSIS — I7101 Dissection of thoracic aorta: Secondary | ICD-10-CM | POA: Diagnosis not present

## 2013-12-04 DIAGNOSIS — R739 Hyperglycemia, unspecified: Secondary | ICD-10-CM | POA: Diagnosis not present

## 2013-12-04 DIAGNOSIS — K219 Gastro-esophageal reflux disease without esophagitis: Secondary | ICD-10-CM | POA: Diagnosis not present

## 2013-12-27 DIAGNOSIS — K219 Gastro-esophageal reflux disease without esophagitis: Secondary | ICD-10-CM | POA: Diagnosis not present

## 2013-12-27 DIAGNOSIS — E559 Vitamin D deficiency, unspecified: Secondary | ICD-10-CM | POA: Diagnosis not present

## 2013-12-27 DIAGNOSIS — I7101 Dissection of thoracic aorta: Secondary | ICD-10-CM | POA: Diagnosis not present

## 2013-12-27 DIAGNOSIS — R739 Hyperglycemia, unspecified: Secondary | ICD-10-CM | POA: Diagnosis not present

## 2013-12-27 DIAGNOSIS — E039 Hypothyroidism, unspecified: Secondary | ICD-10-CM | POA: Diagnosis not present

## 2013-12-27 DIAGNOSIS — M2550 Pain in unspecified joint: Secondary | ICD-10-CM | POA: Diagnosis not present

## 2013-12-27 DIAGNOSIS — G459 Transient cerebral ischemic attack, unspecified: Secondary | ICD-10-CM | POA: Diagnosis not present

## 2014-01-03 DIAGNOSIS — E039 Hypothyroidism, unspecified: Secondary | ICD-10-CM | POA: Diagnosis not present

## 2014-01-03 DIAGNOSIS — I7101 Dissection of thoracic aorta: Secondary | ICD-10-CM | POA: Diagnosis not present

## 2014-01-03 DIAGNOSIS — R739 Hyperglycemia, unspecified: Secondary | ICD-10-CM | POA: Diagnosis not present

## 2014-01-03 DIAGNOSIS — L718 Other rosacea: Secondary | ICD-10-CM | POA: Diagnosis not present

## 2014-01-03 DIAGNOSIS — G459 Transient cerebral ischemic attack, unspecified: Secondary | ICD-10-CM | POA: Diagnosis not present

## 2014-01-03 DIAGNOSIS — K219 Gastro-esophageal reflux disease without esophagitis: Secondary | ICD-10-CM | POA: Diagnosis not present

## 2014-01-03 DIAGNOSIS — Z1389 Encounter for screening for other disorder: Secondary | ICD-10-CM | POA: Diagnosis not present

## 2014-01-25 IMAGING — CT CT ABD-PELV W/ CM
2 of 5 series · 16 of 46 positions shown, 18 images · IV contrast (Omnipaque 300)
Comparison: None.

CLINICAL DATA: Lower abdominal pain

CT ABDOMEN AND PELVIS WITH CONTRAST
TECHNIQUE: Multidetector CT imaging of the abdomen and pelvis was
performed following the standard protocol during bolus
administration of intravenous contrast.
Contrast: 100mL OMNIPAQUE IOHEXOL 300 MG/ML  SOLN

[Series 2: abd_pel_with 5.0 b40f · axial · 0.68mm/px · z∈[-476,-76]mm · 13 of 91 slices shown, 15 images]
[im 6/91  soft-tissue]
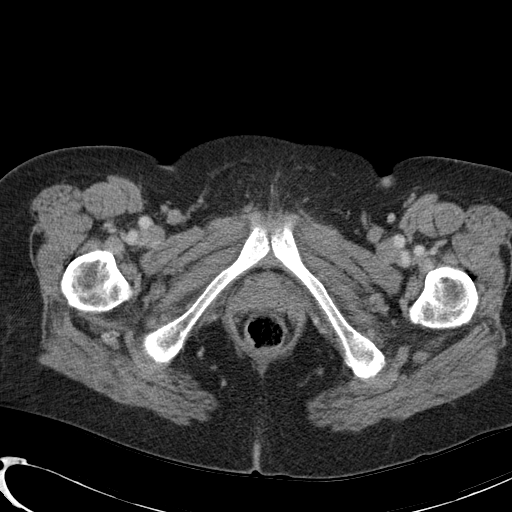
[im 6/91  bone]
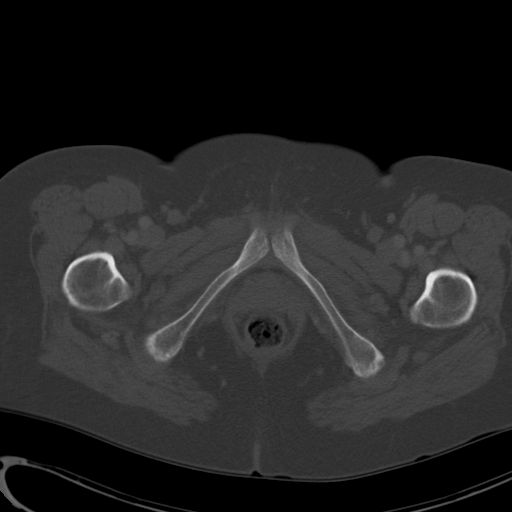
[im 11/91  soft-tissue]
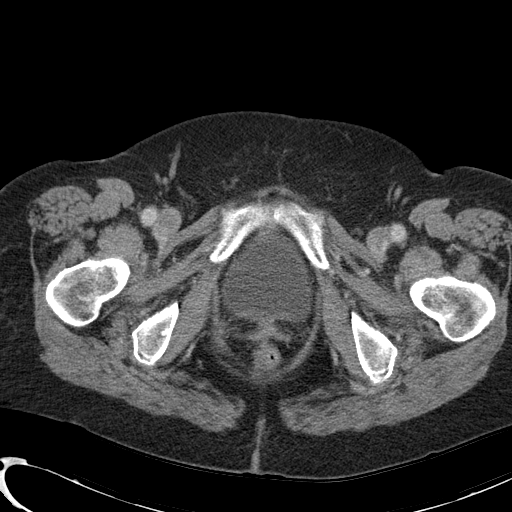
[im 21/91  soft-tissue]
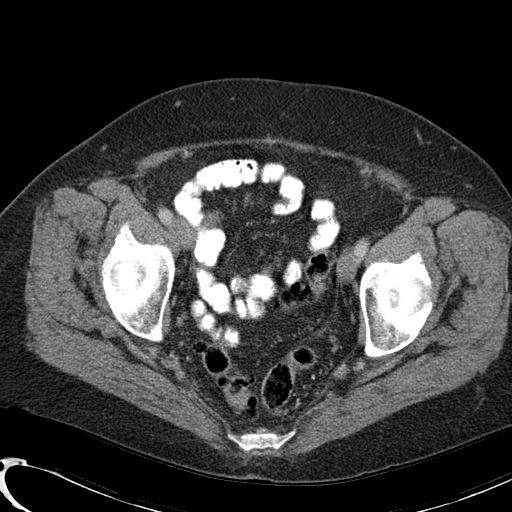
[im 26/91  soft-tissue]
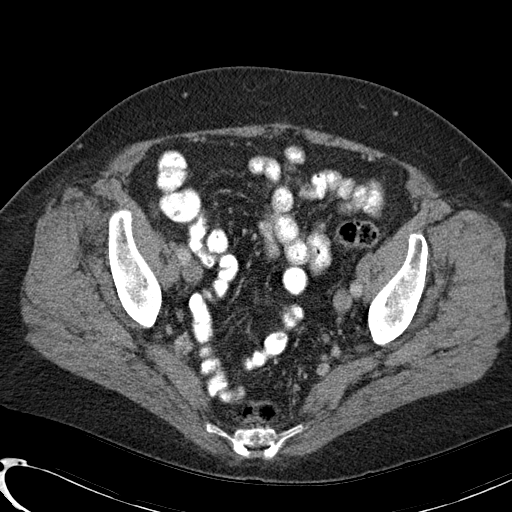
[im 31/91  soft-tissue]
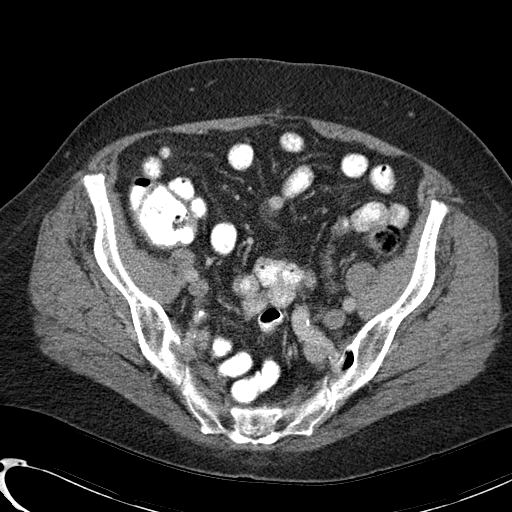
[im 41/91  soft-tissue]
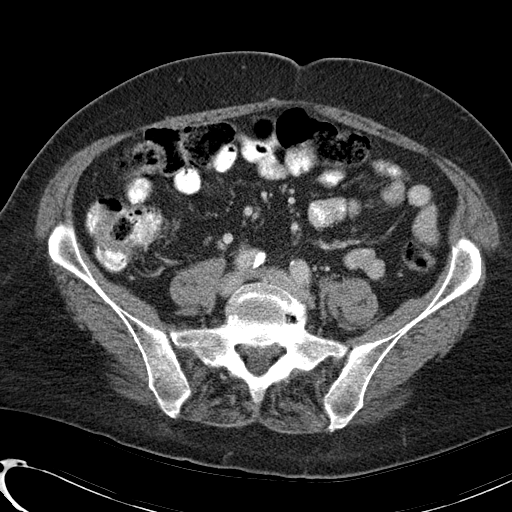
[im 46/91  soft-tissue]
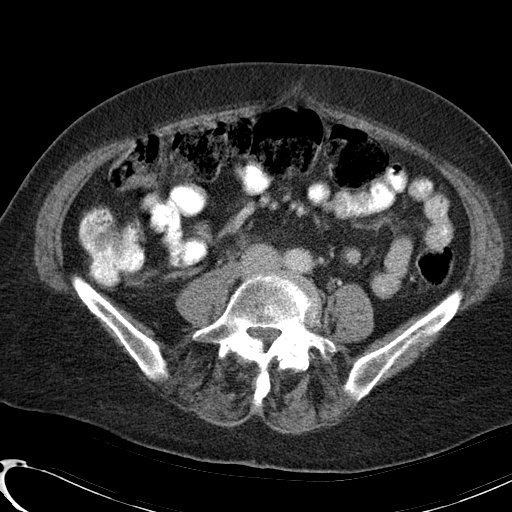
[im 51/91  soft-tissue]
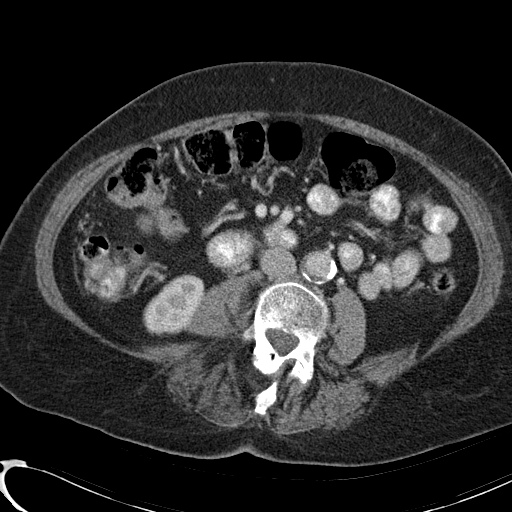
[im 61/91  soft-tissue]
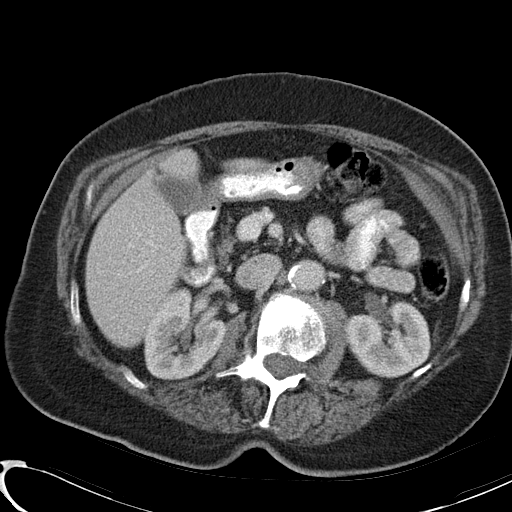
[im 61/91  bone]
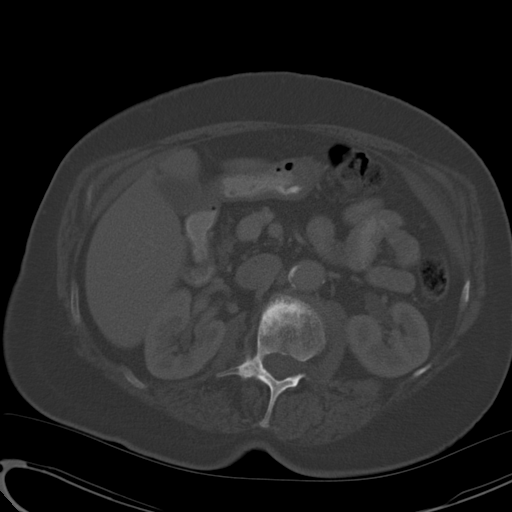
[im 66/91  soft-tissue]
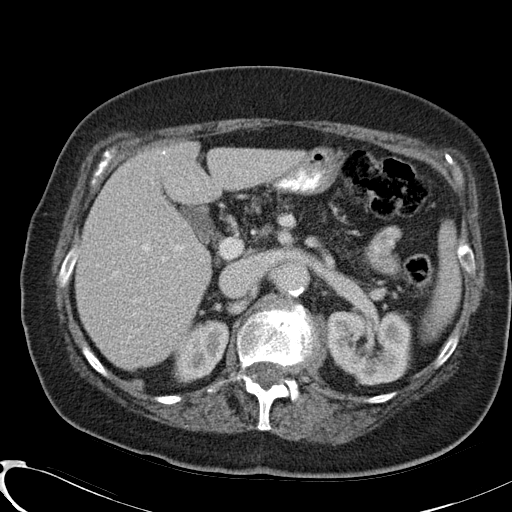
[im 71/91  soft-tissue]
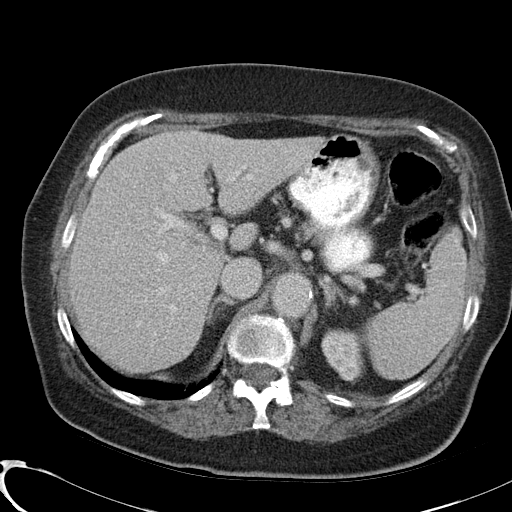
[im 81/91  soft-tissue]
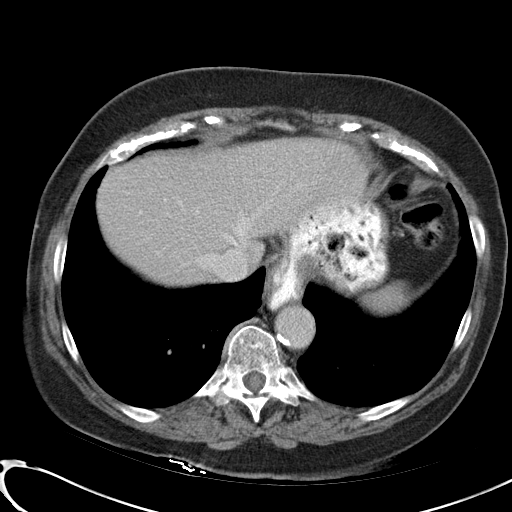
[im 86/91  soft-tissue]
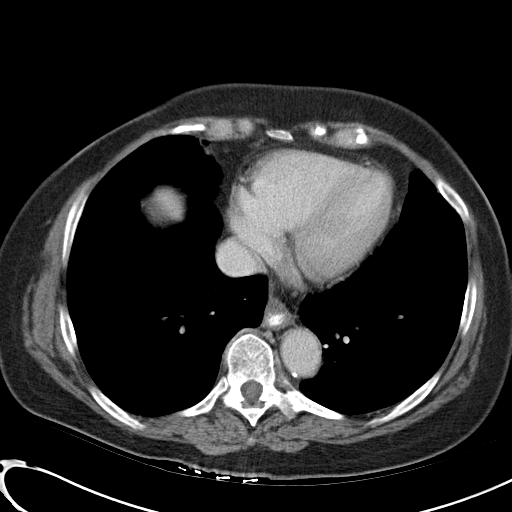

[Series 4: abd_pel_with 3.0 spo · coronal · 0.70mm/px · 3 of 90 slices shown]
[im 30/90  soft-tissue]
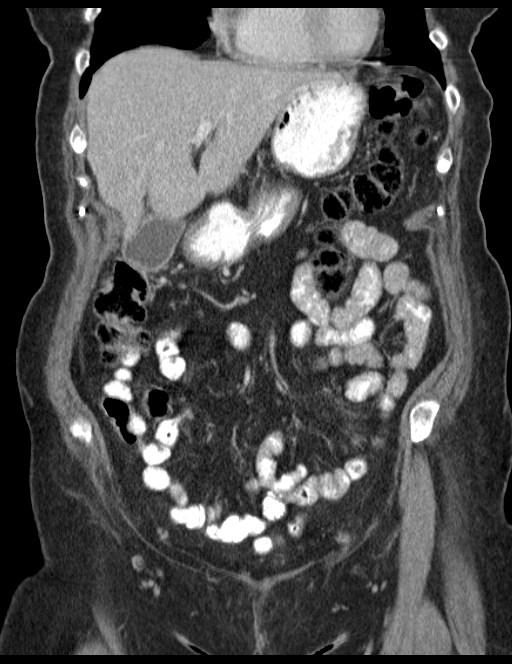
[im 40/90  soft-tissue]
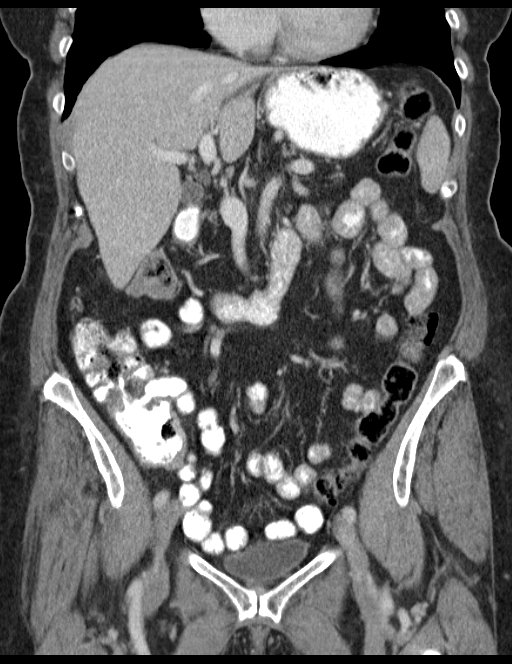
[im 50/90  soft-tissue]
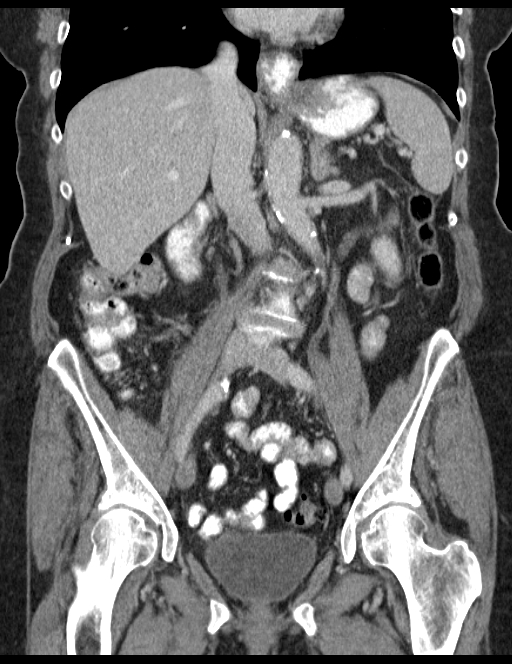

[16 of 46 positions shown; findings below may reference images not displayed]

FINDINGS: The liver, gallbladder, adrenal glands, and kidneys are
within normal limits.  The pancreas is markedly atrophic.
Calcifications in the spleen are present.

Moderate hiatal hernia is noted.  Contrast is seen within the
herniated portion of the stomach.

Diverticulosis of the sigmoid colon is present without evidence of
acute diverticulitis.

Celiac and SMA and grossly patent.  There is irregular calcified
plaque in the infrarenal aorta.  There is also a chronic-appearing
dissection in the aorta on image 40 that does not appear flow
limiting.  It is associated with a penetrating ulcer.

Bladder is within normal limits.  Uterus is absent.  Adnexa are
unremarkable.

Advanced degenerative changes in the lumbar spine.
IMPRESSION: Hiatal hernia.

Pancreas is atrophic.

Sigmoid diverticulosis without evidence of acute diverticulitis.

## 2014-02-08 DIAGNOSIS — S5011XA Contusion of right forearm, initial encounter: Secondary | ICD-10-CM | POA: Diagnosis not present

## 2014-03-27 DIAGNOSIS — Z85828 Personal history of other malignant neoplasm of skin: Secondary | ICD-10-CM | POA: Diagnosis not present

## 2014-03-27 DIAGNOSIS — L719 Rosacea, unspecified: Secondary | ICD-10-CM | POA: Diagnosis not present

## 2014-03-27 DIAGNOSIS — L821 Other seborrheic keratosis: Secondary | ICD-10-CM | POA: Diagnosis not present

## 2014-03-27 DIAGNOSIS — L570 Actinic keratosis: Secondary | ICD-10-CM | POA: Diagnosis not present

## 2014-04-09 DIAGNOSIS — M9905 Segmental and somatic dysfunction of pelvic region: Secondary | ICD-10-CM | POA: Diagnosis not present

## 2014-04-09 DIAGNOSIS — M5137 Other intervertebral disc degeneration, lumbosacral region: Secondary | ICD-10-CM | POA: Diagnosis not present

## 2014-04-10 DIAGNOSIS — M5137 Other intervertebral disc degeneration, lumbosacral region: Secondary | ICD-10-CM | POA: Diagnosis not present

## 2014-04-10 DIAGNOSIS — M9905 Segmental and somatic dysfunction of pelvic region: Secondary | ICD-10-CM | POA: Diagnosis not present

## 2014-04-12 DIAGNOSIS — M9905 Segmental and somatic dysfunction of pelvic region: Secondary | ICD-10-CM | POA: Diagnosis not present

## 2014-04-12 DIAGNOSIS — M5137 Other intervertebral disc degeneration, lumbosacral region: Secondary | ICD-10-CM | POA: Diagnosis not present

## 2014-04-16 DIAGNOSIS — M5137 Other intervertebral disc degeneration, lumbosacral region: Secondary | ICD-10-CM | POA: Diagnosis not present

## 2014-04-16 DIAGNOSIS — M9905 Segmental and somatic dysfunction of pelvic region: Secondary | ICD-10-CM | POA: Diagnosis not present

## 2014-04-18 DIAGNOSIS — M9905 Segmental and somatic dysfunction of pelvic region: Secondary | ICD-10-CM | POA: Diagnosis not present

## 2014-04-18 DIAGNOSIS — M5137 Other intervertebral disc degeneration, lumbosacral region: Secondary | ICD-10-CM | POA: Diagnosis not present

## 2014-04-19 DIAGNOSIS — M9905 Segmental and somatic dysfunction of pelvic region: Secondary | ICD-10-CM | POA: Diagnosis not present

## 2014-04-19 DIAGNOSIS — M5137 Other intervertebral disc degeneration, lumbosacral region: Secondary | ICD-10-CM | POA: Diagnosis not present

## 2014-04-23 DIAGNOSIS — M9905 Segmental and somatic dysfunction of pelvic region: Secondary | ICD-10-CM | POA: Diagnosis not present

## 2014-04-23 DIAGNOSIS — M5137 Other intervertebral disc degeneration, lumbosacral region: Secondary | ICD-10-CM | POA: Diagnosis not present

## 2014-04-25 DIAGNOSIS — M9905 Segmental and somatic dysfunction of pelvic region: Secondary | ICD-10-CM | POA: Diagnosis not present

## 2014-04-25 DIAGNOSIS — M5137 Other intervertebral disc degeneration, lumbosacral region: Secondary | ICD-10-CM | POA: Diagnosis not present

## 2014-04-26 DIAGNOSIS — M5137 Other intervertebral disc degeneration, lumbosacral region: Secondary | ICD-10-CM | POA: Diagnosis not present

## 2014-04-26 DIAGNOSIS — M9905 Segmental and somatic dysfunction of pelvic region: Secondary | ICD-10-CM | POA: Diagnosis not present

## 2014-04-30 DIAGNOSIS — M5137 Other intervertebral disc degeneration, lumbosacral region: Secondary | ICD-10-CM | POA: Diagnosis not present

## 2014-04-30 DIAGNOSIS — M9905 Segmental and somatic dysfunction of pelvic region: Secondary | ICD-10-CM | POA: Diagnosis not present

## 2014-06-18 DIAGNOSIS — R609 Edema, unspecified: Secondary | ICD-10-CM | POA: Diagnosis not present

## 2014-06-18 DIAGNOSIS — M25561 Pain in right knee: Secondary | ICD-10-CM | POA: Diagnosis not present

## 2014-06-19 DIAGNOSIS — M7121 Synovial cyst of popliteal space [Baker], right knee: Secondary | ICD-10-CM | POA: Diagnosis not present

## 2014-06-19 DIAGNOSIS — M79604 Pain in right leg: Secondary | ICD-10-CM | POA: Diagnosis not present

## 2014-06-19 DIAGNOSIS — R600 Localized edema: Secondary | ICD-10-CM | POA: Diagnosis not present

## 2014-06-26 DIAGNOSIS — M7121 Synovial cyst of popliteal space [Baker], right knee: Secondary | ICD-10-CM | POA: Diagnosis not present

## 2014-06-28 DIAGNOSIS — K219 Gastro-esophageal reflux disease without esophagitis: Secondary | ICD-10-CM | POA: Diagnosis not present

## 2014-06-28 DIAGNOSIS — E039 Hypothyroidism, unspecified: Secondary | ICD-10-CM | POA: Diagnosis not present

## 2014-06-28 DIAGNOSIS — R739 Hyperglycemia, unspecified: Secondary | ICD-10-CM | POA: Diagnosis not present

## 2014-07-05 DIAGNOSIS — K219 Gastro-esophageal reflux disease without esophagitis: Secondary | ICD-10-CM | POA: Diagnosis not present

## 2014-07-05 DIAGNOSIS — G459 Transient cerebral ischemic attack, unspecified: Secondary | ICD-10-CM | POA: Diagnosis not present

## 2014-07-05 DIAGNOSIS — E039 Hypothyroidism, unspecified: Secondary | ICD-10-CM | POA: Diagnosis not present

## 2014-07-05 DIAGNOSIS — I7101 Dissection of thoracic aorta: Secondary | ICD-10-CM | POA: Diagnosis not present

## 2014-07-05 DIAGNOSIS — L718 Other rosacea: Secondary | ICD-10-CM | POA: Diagnosis not present

## 2014-07-05 DIAGNOSIS — Z1389 Encounter for screening for other disorder: Secondary | ICD-10-CM | POA: Diagnosis not present

## 2014-07-05 DIAGNOSIS — R739 Hyperglycemia, unspecified: Secondary | ICD-10-CM | POA: Diagnosis not present

## 2014-07-23 ENCOUNTER — Other Ambulatory Visit: Payer: Self-pay

## 2014-08-28 ENCOUNTER — Encounter: Payer: Self-pay | Admitting: Internal Medicine

## 2014-10-02 DIAGNOSIS — L719 Rosacea, unspecified: Secondary | ICD-10-CM | POA: Diagnosis not present

## 2014-10-02 DIAGNOSIS — L821 Other seborrheic keratosis: Secondary | ICD-10-CM | POA: Diagnosis not present

## 2014-10-02 DIAGNOSIS — L570 Actinic keratosis: Secondary | ICD-10-CM | POA: Diagnosis not present

## 2014-10-02 DIAGNOSIS — Z85828 Personal history of other malignant neoplasm of skin: Secondary | ICD-10-CM | POA: Diagnosis not present

## 2014-10-18 DIAGNOSIS — T148 Other injury of unspecified body region: Secondary | ICD-10-CM | POA: Diagnosis not present

## 2014-10-20 DIAGNOSIS — T148 Other injury of unspecified body region: Secondary | ICD-10-CM | POA: Diagnosis not present

## 2014-10-23 ENCOUNTER — Ambulatory Visit (INDEPENDENT_AMBULATORY_CARE_PROVIDER_SITE_OTHER): Payer: Medicare Other | Admitting: Internal Medicine

## 2014-10-23 ENCOUNTER — Encounter: Payer: Self-pay | Admitting: Internal Medicine

## 2014-10-23 VITALS — BP 166/99 | HR 83 | Temp 97.5°F | Ht 67.0 in | Wt 194.6 lb

## 2014-10-23 DIAGNOSIS — R159 Full incontinence of feces: Secondary | ICD-10-CM

## 2014-10-23 DIAGNOSIS — K219 Gastro-esophageal reflux disease without esophagitis: Secondary | ICD-10-CM | POA: Diagnosis not present

## 2014-10-23 DIAGNOSIS — K5900 Constipation, unspecified: Secondary | ICD-10-CM

## 2014-10-23 NOTE — Patient Instructions (Signed)
Stop Benefiber for now  Take 17 g of MiraLax daily at noon to facilitate evacuation of your colon by the next morning as discussed on a daily as-needed basis  Continue Protonix 40 mg daily  Office visit with [place] in 3 months  Call in the interim if any problems

## 2014-10-23 NOTE — Progress Notes (Signed)
Primary Care Physician:  Tonye Becket Primary Gastroenterologist:  Dr. Gala Romney  Pre-Procedure History & Physical: HPI:  Alexandria Jordan is a 81 y.o. female here for follow-up of GERD and constipation. GERD well-controlled on Protonix 40 mg daily.  Has intermittent urinary incontinence;  history of bladder tacking. Has stool leakage intermittently with bladder leakage often. Has urge have a bowel movement usually-sometimes doesn't make it to the bathroom and soils her underclothes. Not sure she is evacuating adequately. Taking Benefiber. No other laxative.. Weight actually up 3-1/2 pounds since her last visit.  Past Medical History  Diagnosis Date  . GERD (gastroesophageal reflux disease)   . Hx of adenomatous colonic polyps   . TIA (transient ischemic attack)   . Hypothyroidism   . Adenoma 08/19/2006    tubular adenoma  . Diverticulosis   . Hemorrhoids   . Stroke     Past Surgical History  Procedure Laterality Date  . Colonoscopy  07/2006    tubular adenoma, , left-sided diverticula  . Esophagogastroduodenoscopy  01/2002    schatzki ring (not manipulated), small to moderate hh  . Hysterectomy for endometriosis and fibroids  1978  . Bladder tack  2001  . Colonoscopy  06/17/2011    Dr. Gala Romney- colonic diverticulosis, colonic polyps-tubular adenoma  . Abdominal hysterectomy    . Cesarean section      Prior to Admission medications   Medication Sig Start Date End Date Taking? Authorizing Provider  albuterol (PROVENTIL HFA;VENTOLIN HFA) 108 (90 BASE) MCG/ACT inhaler Inhale 2 puffs into the lungs every 6 (six) hours as needed. For shortness of breath   Yes Historical Provider, MD  Azelaic Acid (FINACEA) 15 % cream Apply 1 application topically 2 (two) times daily. After skin is thoroughly washed and patted dry, gently but thoroughly massage a thin film of azelaic acid cream into the affected area twice daily, in the morning and evening.   Yes Historical Provider, MD  Calcium  Carbonate-Vit D-Min (CALTRATE 600+D PLUS) 600-400 MG-UNIT per tablet Take 1 tablet by mouth every morning.   Yes Historical Provider, MD  cholecalciferol (VITAMIN D) 1000 UNITS tablet Take 1,000 Units by mouth daily.   Yes Historical Provider, MD  clobetasol cream (TEMOVATE) 0.05 %  10/04/13  Yes Historical Provider, MD  clopidogrel (PLAVIX) 75 MG tablet Take 1 tablet by mouth daily. 05/18/12  Yes Historical Provider, MD  DETROL LA 4 MG 24 hr capsule Take 4 mg by mouth every morning.  05/13/11  Yes Historical Provider, MD  Digestive Enzymes (DIGESTIVE SUPPORT PO) Take by mouth daily.   Yes Historical Provider, MD  fluticasone (FLONASE) 50 MCG/ACT nasal spray Place 2 sprays into the nose at bedtime.   Yes Historical Provider, MD  folic acid (FOLVITE) 1 MG tablet Take 800 mg by mouth every morning.    Yes Historical Provider, MD  gabapentin (NEURONTIN) 100 MG capsule Take 100 mg by mouth at bedtime.   Yes Historical Provider, MD  metroNIDAZOLE (METROCREAM) 0.75 % cream  10/04/13  Yes Historical Provider, MD  oxybutynin (DITROPAN-XL) 10 MG 24 hr tablet Take 10 mg by mouth daily.  08/10/12  Yes Historical Provider, MD  pantoprazole (PROTONIX) 40 MG tablet Take 1 tablet by mouth daily. 06/13/12  Yes Historical Provider, MD  Sulfacetamide Sodium-Sulfur 10-2 % CREA Apply 1 application topically every morning.   Yes Historical Provider, MD  SYNTHROID 125 MCG tablet Take 125 mcg by mouth daily before breakfast.  05/12/12  Yes Historical Provider, MD  Thiamine HCl (  VITAMIN B-1) 100 MG tablet Take 1,000 mg by mouth every morning.    Yes Historical Provider, MD  triamcinolone cream (KENALOG) 0.1 % Apply 1 application topically.  07/07/12  Yes Historical Provider, MD  vitamin B-12 (CYANOCOBALAMIN) 1000 MCG tablet Take 1,000 mcg by mouth every morning.    Yes Historical Provider, MD    Allergies as of 10/23/2014 - Review Complete 10/23/2014  Allergen Reaction Noted  . Adhesive [tape] Rash 06/15/2011  . Fulvicin u-f  [griseofulvin] Rash 05/22/2010    Family History  Problem Relation Age of Onset  . Diabetes Mother   . Other Mother     varicose veins    Social History   Social History  . Marital Status: Widowed    Spouse Name: N/A  . Number of Children: N/A  . Years of Education: N/A   Occupational History  . Not on file.   Social History Main Topics  . Smoking status: Never Smoker   . Smokeless tobacco: Never Used  . Alcohol Use: No  . Drug Use: No  . Sexual Activity: Not on file   Other Topics Concern  . Not on file   Social History Narrative    Review of Systems: See HPI, otherwise negative ROS  Physical Exam: BP 166/99 mmHg  Pulse 83  Temp(Src) 97.5 F (36.4 C)  Ht 5\' 7"  (1.702 m)  Wt 194 lb 9.6 oz (88.27 kg)  BMI 30.47 kg/m2 General:   pleasant and cooperative in NAD Skin:  Intact without significant lesions or rashes. Eyes:  Sclera clear, no icterus.   Conjunctiva pink. Neck:  Supple; no masses or thyromegaly. No significant cervical adenopathy. Lungs:  Clear throughout to auscultation.   No wheezes, crackles, or rhonchi. No acute distress. Heart:  Regular rate and rhythm; no murmurs, clicks, rubs,  or gallops. Abdomen: Non-distended, normal bowel sounds.  Soft and nontender without appreciable mass or hepatosplenomegaly.  Pulses:  Normal pulses noted. Extremities:  Without clubbing or edema. Rectal:  No external lesions. Moderately good sphincter tone baseline adequate voluntary squeeze .  No impaction. No mass. Scant brown stool present in the rectal vault. Hemoccult negative.   Impression:  Pleasant 81 year old lady with GERD-well controlled on Protonix. Patient having some bowel function issue with some difficulty evacuating. Some urge in incontinence along with some urinary incontinence. She is not impacted. She may not be evacuating not adequately. Fiber doesn't seem to be making much difference.  Recommendations:  Will have her stop Benefiber for now. Start  MiraLax and take 17 g of MiraLax daily at noon to facilitate evacuation of your colon by the next morning as discussed on a daily as-needed basis Continue Protonix 40 mg daily.  Office visit with [place] in 3 months.  Call in the interim if any problems   Notice: This dictation was prepared with Dragon dictation along with smaller phrase technology. Any transcriptional errors that result from this process are unintentional and may not be corrected upon review.

## 2014-10-25 DIAGNOSIS — S41102D Unspecified open wound of left upper arm, subsequent encounter: Secondary | ICD-10-CM | POA: Diagnosis not present

## 2014-12-11 DIAGNOSIS — Z23 Encounter for immunization: Secondary | ICD-10-CM | POA: Diagnosis not present

## 2014-12-12 ENCOUNTER — Encounter: Payer: Self-pay | Admitting: Internal Medicine

## 2014-12-31 DIAGNOSIS — K219 Gastro-esophageal reflux disease without esophagitis: Secondary | ICD-10-CM | POA: Diagnosis not present

## 2014-12-31 DIAGNOSIS — R739 Hyperglycemia, unspecified: Secondary | ICD-10-CM | POA: Diagnosis not present

## 2014-12-31 DIAGNOSIS — E039 Hypothyroidism, unspecified: Secondary | ICD-10-CM | POA: Diagnosis not present

## 2014-12-31 DIAGNOSIS — G459 Transient cerebral ischemic attack, unspecified: Secondary | ICD-10-CM | POA: Diagnosis not present

## 2015-01-02 ENCOUNTER — Encounter: Payer: Self-pay | Admitting: Internal Medicine

## 2015-01-14 DIAGNOSIS — R739 Hyperglycemia, unspecified: Secondary | ICD-10-CM | POA: Diagnosis not present

## 2015-01-14 DIAGNOSIS — M25561 Pain in right knee: Secondary | ICD-10-CM | POA: Diagnosis not present

## 2015-01-14 DIAGNOSIS — K219 Gastro-esophageal reflux disease without esophagitis: Secondary | ICD-10-CM | POA: Diagnosis not present

## 2015-01-14 DIAGNOSIS — I7101 Dissection of thoracic aorta: Secondary | ICD-10-CM | POA: Diagnosis not present

## 2015-01-14 DIAGNOSIS — L718 Other rosacea: Secondary | ICD-10-CM | POA: Diagnosis not present

## 2015-01-14 DIAGNOSIS — E039 Hypothyroidism, unspecified: Secondary | ICD-10-CM | POA: Diagnosis not present

## 2015-01-14 DIAGNOSIS — G459 Transient cerebral ischemic attack, unspecified: Secondary | ICD-10-CM | POA: Diagnosis not present

## 2015-01-14 DIAGNOSIS — Z0000 Encounter for general adult medical examination without abnormal findings: Secondary | ICD-10-CM | POA: Diagnosis not present

## 2015-01-31 DIAGNOSIS — Z1231 Encounter for screening mammogram for malignant neoplasm of breast: Secondary | ICD-10-CM | POA: Diagnosis not present

## 2015-01-31 DIAGNOSIS — R921 Mammographic calcification found on diagnostic imaging of breast: Secondary | ICD-10-CM | POA: Diagnosis not present

## 2015-02-01 ENCOUNTER — Ambulatory Visit: Payer: Medicare Other | Admitting: Internal Medicine

## 2015-02-05 ENCOUNTER — Ambulatory Visit (INDEPENDENT_AMBULATORY_CARE_PROVIDER_SITE_OTHER): Payer: Medicare Other | Admitting: Internal Medicine

## 2015-02-05 ENCOUNTER — Encounter: Payer: Self-pay | Admitting: Internal Medicine

## 2015-02-05 VITALS — BP 159/99 | HR 86 | Temp 98.1°F | Ht 67.0 in | Wt 194.2 lb

## 2015-02-05 DIAGNOSIS — R198 Other specified symptoms and signs involving the digestive system and abdomen: Secondary | ICD-10-CM

## 2015-02-05 DIAGNOSIS — Z86010 Personal history of colonic polyps: Secondary | ICD-10-CM | POA: Diagnosis not present

## 2015-02-05 DIAGNOSIS — K219 Gastro-esophageal reflux disease without esophagitis: Secondary | ICD-10-CM | POA: Diagnosis not present

## 2015-02-05 DIAGNOSIS — R140 Abdominal distension (gaseous): Secondary | ICD-10-CM

## 2015-02-05 DIAGNOSIS — R194 Change in bowel habit: Secondary | ICD-10-CM | POA: Diagnosis not present

## 2015-02-05 NOTE — Progress Notes (Signed)
Primary Care Physician:  Tonye Becket Primary Gastroenterologist:  Dr. Gala Romney  Pre-Procedure History & Physical: HPI:  Alexandria Jordan is a 82 y.o. female here for follow-up of GERD and altered bowel function. Was felt she was not evacuating adequately which was last year. MiraLax prescribed. She states she had multiple loose bowel movements daily -  this was too much as she reports. She stopped it. She did not call in. However, she states since she took a course of MiraLax she has regular formed bowel movements-frequently has a bowel movement at the time of urinating, however,  does not have any incontinence, whatsoever. Occasional gas bloat symptoms. Overall, she is happy; she's not having any bleeding. She's accompanied by her daughter today. Previously tried fiber supplementation without much improvement. Has also tried a probiotic but does not recall if it helped.  History of multiple colonic adenomas removed 2013; Tentatively,  on schedule for one more colonoscopy 2018.  Past Medical History  Diagnosis Date  . GERD (gastroesophageal reflux disease)   . Hx of adenomatous colonic polyps   . TIA (transient ischemic attack)   . Hypothyroidism   . Adenoma 08/19/2006    tubular adenoma  . Diverticulosis   . Hemorrhoids   . Stroke [facility])     Past Surgical History  Procedure Laterality Date  . Colonoscopy  07/2006    tubular adenoma, , left-sided diverticula  . Esophagogastroduodenoscopy  01/2002    schatzki ring (not manipulated), small to moderate hh  . Hysterectomy for endometriosis and fibroids  1978  . Bladder tack  2001  . Colonoscopy  06/17/2011    Dr. Gala Romney- colonic diverticulosis, colonic polyps-tubular adenoma  . Abdominal hysterectomy    . Cesarean section      Prior to Admission medications   Medication Sig Start Date End Date Taking? Authorizing Provider  albuterol (PROVENTIL HFA;VENTOLIN HFA) 108 (90 BASE) MCG/ACT inhaler Inhale 2 puffs into the lungs every 6 (six)  hours as needed. For shortness of breath   Yes Historical Provider, MD  Azelaic Acid (FINACEA) 15 % cream Apply 1 application topically 2 (two) times daily. After skin is thoroughly washed and patted dry, gently but thoroughly massage a thin film of azelaic acid cream into the affected area twice daily, in the morning and evening.   Yes Historical Provider, MD  Calcium Carbonate-Vit D-Min (CALTRATE 600+D PLUS) 600-400 MG-UNIT per tablet Take 1 tablet by mouth every morning.   Yes Historical Provider, MD  cholecalciferol (VITAMIN D) 1000 UNITS tablet Take 1,000 Units by mouth daily.   Yes Historical Provider, MD  clobetasol cream (TEMOVATE) 0.05 %  10/04/13  Yes Historical Provider, MD  clopidogrel (PLAVIX) 75 MG tablet Take 1 tablet by mouth daily. 05/18/12  Yes Historical Provider, MD  DETROL LA 4 MG 24 hr capsule Take 4 mg by mouth every morning.  05/13/11  Yes Historical Provider, MD  Digestive Enzymes (DIGESTIVE SUPPORT PO) Take by mouth daily.   Yes Historical Provider, MD  fluticasone (FLONASE) 50 MCG/ACT nasal spray Place 2 sprays into the nose at bedtime.   Yes Historical Provider, MD  folic acid (FOLVITE) 1 MG tablet Take 800 mg by mouth every morning.    Yes Historical Provider, MD  gabapentin (NEURONTIN) 100 MG capsule Take 100 mg by mouth at bedtime.   Yes Historical Provider, MD  metroNIDAZOLE (METROCREAM) 0.75 % cream  10/04/13  Yes Historical Provider, MD  oxybutynin (DITROPAN-XL) 10 MG 24 hr tablet Take 10 mg by  mouth daily.  08/10/12  Yes Historical Provider, MD  pantoprazole (PROTONIX) 40 MG tablet Take 1 tablet by mouth daily. 06/13/12  Yes Historical Provider, MD  Sulfacetamide Sodium-Sulfur 10-2 % CREA Apply 1 application topically every morning.   Yes Historical Provider, MD  SYNTHROID 125 MCG tablet Take 125 mcg by mouth daily before breakfast.  05/12/12  Yes Historical Provider, MD  Thiamine HCl (VITAMIN B-1) 100 MG tablet Take 1,000 mg by mouth every morning.    Yes Historical  Provider, MD  triamcinolone cream (KENALOG) 0.1 % Apply 1 application topically.  07/07/12  Yes Historical Provider, MD  vitamin B-12 (CYANOCOBALAMIN) 1000 MCG tablet Take 1,000 mcg by mouth every morning.    Yes Historical Provider, MD    Allergies as of 02/05/2015 - Review Complete 02/05/2015  Allergen Reaction Noted  . Adhesive [tape] Rash 06/15/2011  . Fulvicin u-f [griseofulvin] Rash 05/22/2010    Family History  Problem Relation Age of Onset  . Diabetes Mother   . Other Mother     varicose veins    Social History   Social History  . Marital Status: Widowed    Spouse Name: N/A  . Number of Children: N/A  . Years of Education: N/A   Occupational History  . Not on file.   Social History Main Topics  . Smoking status: Never Smoker   . Smokeless tobacco: Never Used  . Alcohol Use: No  . Drug Use: No  . Sexual Activity: Not on file   Other Topics Concern  . Not on file   Social History Narrative    Review of Systems: See HPI, otherwise negative ROS  Physical Exam: BP 159/99 mmHg  Pulse 86  Temp(Src) 98.1 F (36.7 C) (Oral)  Ht 5\' 7"  (1.702 m)  Wt 194 lb 3.2 oz (88.089 kg)  BMI 30.41 kg/m2 General:   Alert,  Well-developed, well-nourished, pleasant and cooperative in NAD Skin:  Intact without significant lesions or rashes. Eyes:  Sclera clear, no icterus.   Conjunctiva pink. Ears:  Normal auditory acuity. Nose:  No deformity, discharge,  or lesions. Mouth:  No deformity or lesions. Neck:  Supple; no masses or thyromegaly. No significant cervical adenopathy. Lungs:  Clear throughout to auscultation.   No wheezes, crackles, or rhonchi. No acute distress. Heart:  Regular rate and rhythm; no murmurs, clicks, rubs,  or gallops. Abdomen: Non-distended, normal bowel sounds.  Soft and nontender without appreciable mass or hepatosplenomegaly.  Pulses:  Normal pulses noted. Extremities:  Without clubbing or edema.  Impression:  Very pleasant 82 year old lady with  GERD  -  well-controlled on Protonix. No alarm symptoms. Altered bowel function chronically as described above. Things seem to have improved now. No incontinence. When she has a stool it is formed. Frequently occurs with urination. She has occasional gas/bloat symptoms which are nonspecific and don't appear to be a big problem for this nice lady at this time.  Recommendations:    Continue Protonix 40 mg daily  Add daily Activia or digestive advantage for gas/bloat  Office visit in 6 months  May or may not need a surveillance colonoscopy next year.  Call if any interim problems    Notice: This dictation was prepared with Dragon dictation along with smaller phrase technology. Any transcriptional errors that result from this process are unintentional and may not be corrected upon review.

## 2015-02-05 NOTE — Patient Instructions (Addendum)
Continue Protonix 40 mg daily  Add Activia or digestive advantage for gas/bloat  Office visit in 6 months  Call if any interim problems

## 2015-02-08 DIAGNOSIS — I10 Essential (primary) hypertension: Secondary | ICD-10-CM | POA: Diagnosis not present

## 2015-02-08 DIAGNOSIS — J0101 Acute recurrent maxillary sinusitis: Secondary | ICD-10-CM | POA: Diagnosis not present

## 2015-02-14 DIAGNOSIS — M6281 Muscle weakness (generalized): Secondary | ICD-10-CM | POA: Diagnosis not present

## 2015-02-14 DIAGNOSIS — R2681 Unsteadiness on feet: Secondary | ICD-10-CM | POA: Diagnosis not present

## 2015-02-18 DIAGNOSIS — M6281 Muscle weakness (generalized): Secondary | ICD-10-CM | POA: Diagnosis not present

## 2015-02-18 DIAGNOSIS — R2681 Unsteadiness on feet: Secondary | ICD-10-CM | POA: Diagnosis not present

## 2015-02-20 ENCOUNTER — Other Ambulatory Visit: Payer: Self-pay | Admitting: Family Medicine

## 2015-02-20 DIAGNOSIS — R921 Mammographic calcification found on diagnostic imaging of breast: Secondary | ICD-10-CM | POA: Diagnosis not present

## 2015-02-21 DIAGNOSIS — M6281 Muscle weakness (generalized): Secondary | ICD-10-CM | POA: Diagnosis not present

## 2015-02-21 DIAGNOSIS — R2681 Unsteadiness on feet: Secondary | ICD-10-CM | POA: Diagnosis not present

## 2015-02-25 DIAGNOSIS — M6281 Muscle weakness (generalized): Secondary | ICD-10-CM | POA: Diagnosis not present

## 2015-02-25 DIAGNOSIS — R2681 Unsteadiness on feet: Secondary | ICD-10-CM | POA: Diagnosis not present

## 2015-02-27 DIAGNOSIS — M6281 Muscle weakness (generalized): Secondary | ICD-10-CM | POA: Diagnosis not present

## 2015-02-27 DIAGNOSIS — R2681 Unsteadiness on feet: Secondary | ICD-10-CM | POA: Diagnosis not present

## 2015-03-01 ENCOUNTER — Ambulatory Visit
Admission: RE | Admit: 2015-03-01 | Discharge: 2015-03-01 | Disposition: A | Discharge status: Home / Self Care | Payer: Medicare Other | Source: Ambulatory Visit | Attending: Family Medicine | Admitting: Family Medicine

## 2015-03-01 DIAGNOSIS — R928 Other abnormal and inconclusive findings on diagnostic imaging of breast: Secondary | ICD-10-CM | POA: Diagnosis not present

## 2015-03-01 DIAGNOSIS — R921 Mammographic calcification found on diagnostic imaging of breast: Secondary | ICD-10-CM

## 2015-03-01 DIAGNOSIS — N641 Fat necrosis of breast: Secondary | ICD-10-CM | POA: Diagnosis not present

## 2015-03-06 DIAGNOSIS — R2681 Unsteadiness on feet: Secondary | ICD-10-CM | POA: Diagnosis not present

## 2015-03-06 DIAGNOSIS — M6281 Muscle weakness (generalized): Secondary | ICD-10-CM | POA: Diagnosis not present

## 2015-03-11 DIAGNOSIS — M6281 Muscle weakness (generalized): Secondary | ICD-10-CM | POA: Diagnosis not present

## 2015-03-11 DIAGNOSIS — R2681 Unsteadiness on feet: Secondary | ICD-10-CM | POA: Diagnosis not present

## 2015-03-13 DIAGNOSIS — R2681 Unsteadiness on feet: Secondary | ICD-10-CM | POA: Diagnosis not present

## 2015-03-13 DIAGNOSIS — M6281 Muscle weakness (generalized): Secondary | ICD-10-CM | POA: Diagnosis not present

## 2015-03-18 DIAGNOSIS — R2681 Unsteadiness on feet: Secondary | ICD-10-CM | POA: Diagnosis not present

## 2015-03-18 DIAGNOSIS — I10 Essential (primary) hypertension: Secondary | ICD-10-CM | POA: Diagnosis not present

## 2015-03-18 DIAGNOSIS — M6281 Muscle weakness (generalized): Secondary | ICD-10-CM | POA: Diagnosis not present

## 2015-03-25 DIAGNOSIS — M6281 Muscle weakness (generalized): Secondary | ICD-10-CM | POA: Diagnosis not present

## 2015-03-25 DIAGNOSIS — R2681 Unsteadiness on feet: Secondary | ICD-10-CM | POA: Diagnosis not present

## 2015-03-27 DIAGNOSIS — M6281 Muscle weakness (generalized): Secondary | ICD-10-CM | POA: Diagnosis not present

## 2015-03-27 DIAGNOSIS — R2681 Unsteadiness on feet: Secondary | ICD-10-CM | POA: Diagnosis not present

## 2015-04-01 DIAGNOSIS — R2681 Unsteadiness on feet: Secondary | ICD-10-CM | POA: Diagnosis not present

## 2015-04-01 DIAGNOSIS — M6281 Muscle weakness (generalized): Secondary | ICD-10-CM | POA: Diagnosis not present

## 2015-04-03 DIAGNOSIS — M6281 Muscle weakness (generalized): Secondary | ICD-10-CM | POA: Diagnosis not present

## 2015-04-03 DIAGNOSIS — R2681 Unsteadiness on feet: Secondary | ICD-10-CM | POA: Diagnosis not present

## 2015-04-08 DIAGNOSIS — R2681 Unsteadiness on feet: Secondary | ICD-10-CM | POA: Diagnosis not present

## 2015-04-08 DIAGNOSIS — M6281 Muscle weakness (generalized): Secondary | ICD-10-CM | POA: Diagnosis not present

## 2015-04-10 DIAGNOSIS — M6281 Muscle weakness (generalized): Secondary | ICD-10-CM | POA: Diagnosis not present

## 2015-04-10 DIAGNOSIS — R2681 Unsteadiness on feet: Secondary | ICD-10-CM | POA: Diagnosis not present

## 2015-04-23 ENCOUNTER — Encounter: Payer: Self-pay | Admitting: Internal Medicine

## 2015-04-23 ENCOUNTER — Other Ambulatory Visit: Payer: Self-pay

## 2015-04-23 ENCOUNTER — Ambulatory Visit (INDEPENDENT_AMBULATORY_CARE_PROVIDER_SITE_OTHER): Payer: Medicare Other | Admitting: Internal Medicine

## 2015-04-23 VITALS — BP 136/91 | HR 94 | Temp 98.4°F | Ht 65.0 in | Wt 188.4 lb

## 2015-04-23 DIAGNOSIS — R195 Other fecal abnormalities: Secondary | ICD-10-CM | POA: Diagnosis not present

## 2015-04-23 DIAGNOSIS — K921 Melena: Secondary | ICD-10-CM

## 2015-04-23 LAB — CBC WITH DIFFERENTIAL/PLATELET
BASOS ABS: 0.1 10*3/uL (ref 0.0–0.1)
Basophils Relative: 1 % (ref 0–1)
Eosinophils Absolute: 0.1 10*3/uL (ref 0.0–0.7)
Eosinophils Relative: 2 % (ref 0–5)
HEMATOCRIT: 41 % (ref 36.0–46.0)
HEMOGLOBIN: 14.1 g/dL (ref 12.0–15.0)
LYMPHS PCT: 30 % (ref 12–46)
Lymphs Abs: 2 10*3/uL (ref 0.7–4.0)
MCH: 30.7 pg (ref 26.0–34.0)
MCHC: 34.4 g/dL (ref 30.0–36.0)
MCV: 89.3 fL (ref 78.0–100.0)
MPV: 9.9 fL (ref 8.6–12.4)
Monocytes Absolute: 0.7 10*3/uL (ref 0.1–1.0)
Monocytes Relative: 11 % (ref 3–12)
NEUTROS ABS: 3.8 10*3/uL (ref 1.7–7.7)
Neutrophils Relative %: 56 % (ref 43–77)
Platelets: 173 10*3/uL (ref 150–400)
RBC: 4.59 MIL/uL (ref 3.87–5.11)
RDW: 14.5 % (ref 11.5–15.5)
WBC: 6.7 10*3/uL (ref 4.0–10.5)

## 2015-04-23 NOTE — Patient Instructions (Signed)
We'll schedule a diagnostic upper endoscopy because of black stools/melena  Obtain a CBC today  Further recommendations to follow.

## 2015-04-23 NOTE — Progress Notes (Signed)
Primary Care Physician:  Tonye Becket Primary Gastroenterologist:  Dr. Gala Romney  Pre-Procedure History & Physical: HPI:  Alexandria Jordan is a 82 y.o. female here for because of a 24 history of black tarry stools with some incontinence which occurred a week ago. States she really wasn't having abdominal pain;  had multiple dark/ black stools. No recent Pepto-Bismol. Reflux symptoms well controlled on Protonix 40 mg daily; no dysphagia. Takes Plavix daily  - no other antiplatelet or anticoagulation therapy.  Denies aspirin or other NSAIDs.  Last EGD was back in 2004; noncritical Schatzki ring, hiatal hernia. Last colonoscopy 2013. Diverticulosis and colonic adenoma removed. Denies frank abdominal pain but does complain of abdominal "rumbling"  Hasn't had any hematochezia. No recent labs.  History of aortic dissection managed conservatively. She's not had any major vascular surgery.  Patient denies hematochezia, diarrhea. Takes a probiotic sporadically. Last colonoscopy 2013 diverticulosis and colonic adenoma.  On Plavix chronically.  Past Medical History  Diagnosis Date  . GERD (gastroesophageal reflux disease)   . Hx of adenomatous colonic polyps   . TIA (transient ischemic attack)   . Hypothyroidism   . Adenoma 08/19/2006    tubular adenoma  . Diverticulosis   . Hemorrhoids   . Stroke [facility])     Past Surgical History  Procedure Laterality Date  . Colonoscopy  07/2006    tubular adenoma, , left-sided diverticula  . Esophagogastroduodenoscopy  01/2002    schatzki ring (not manipulated), small to moderate hh  . Hysterectomy for endometriosis and fibroids  1978  . Bladder tack  2001  . Colonoscopy  06/17/2011    Dr. Gala Romney- colonic diverticulosis, colonic polyps-tubular adenoma  . Abdominal hysterectomy    . Cesarean section      Prior to Admission medications   Medication Sig Start Date End Date Taking? Authorizing Provider  albuterol (PROVENTIL HFA;VENTOLIN HFA) 108 (90 BASE)  MCG/ACT inhaler Inhale 2 puffs into the lungs every 6 (six) hours as needed. For shortness of breath   Yes Historical Provider, MD  Azelaic Acid (FINACEA) 15 % cream Apply 1 application topically 2 (two) times daily. After skin is thoroughly washed and patted dry, gently but thoroughly massage a thin film of azelaic acid cream into the affected area twice daily, in the morning and evening.   Yes Historical Provider, MD  Calcium Carbonate-Vit D-Min (CALTRATE 600+D PLUS) 600-400 MG-UNIT per tablet Take 1 tablet by mouth every morning.   Yes Historical Provider, MD  cholecalciferol (VITAMIN D) 1000 UNITS tablet Take 1,000 Units by mouth daily.   Yes Historical Provider, MD  clobetasol cream (TEMOVATE) 0.05 %  10/04/13  Yes Historical Provider, MD  clopidogrel (PLAVIX) 75 MG tablet Take 1 tablet by mouth daily. 05/18/12  Yes Historical Provider, MD  DETROL LA 4 MG 24 hr capsule Take 4 mg by mouth every morning.  05/13/11  Yes Historical Provider, MD  Digestive Enzymes (DIGESTIVE SUPPORT PO) Take by mouth daily.   Yes Historical Provider, MD  fluticasone (FLONASE) 50 MCG/ACT nasal spray Place 2 sprays into the nose at bedtime.   Yes Historical Provider, MD  folic acid (FOLVITE) 1 MG tablet Take 800 mg by mouth every morning.    Yes Historical Provider, MD  gabapentin (NEURONTIN) 100 MG capsule Take 100 mg by mouth at bedtime.   Yes Historical Provider, MD  losartan (COZAAR) 25 MG tablet Take by mouth daily.   Yes Historical Provider, MD  metroNIDAZOLE (METROCREAM) 0.75 % cream  10/04/13  Yes  Historical Provider, MD  oxybutynin (DITROPAN-XL) 10 MG 24 hr tablet Take 10 mg by mouth daily.  08/10/12  Yes Historical Provider, MD  pantoprazole (PROTONIX) 40 MG tablet Take 1 tablet by mouth daily. 06/13/12  Yes Historical Provider, MD  Sulfacetamide Sodium-Sulfur 10-2 % CREA Apply 1 application topically every morning.   Yes Historical Provider, MD  SYNTHROID 125 MCG tablet Take 125 mcg by mouth daily before breakfast.   05/12/12  Yes Historical Provider, MD  Thiamine HCl (VITAMIN B-1) 100 MG tablet Take 1,000 mg by mouth every morning.    Yes Historical Provider, MD  triamcinolone cream (KENALOG) 0.1 % Apply 1 application topically.  07/07/12  Yes Historical Provider, MD  vitamin B-12 (CYANOCOBALAMIN) 1000 MCG tablet Take 1,000 mcg by mouth every morning.    Yes Historical Provider, MD    Allergies as of 04/23/2015 - Review Complete 04/23/2015  Allergen Reaction Noted  . Adhesive [tape] Rash 06/15/2011  . Fulvicin u-f [griseofulvin] Rash 05/22/2010    Family History  Problem Relation Age of Onset  . Diabetes Mother   . Other Mother     varicose veins    Social History   Social History  . Marital Status: Widowed    Spouse Name: N/A  . Number of Children: N/A  . Years of Education: N/A   Occupational History  . Not on file.   Social History Main Topics  . Smoking status: Never Smoker   . Smokeless tobacco: Never Used  . Alcohol Use: No  . Drug Use: No  . Sexual Activity: Not on file   Other Topics Concern  . Not on file   Social History Narrative    Review of Systems: See HPI, otherwise negative ROS  Physical Exam: BP 136/91 mmHg  Pulse 94  Temp(Src) 98.4 F (36.9 C)  Ht 5\' 5"  (1.651 m)  Wt 188 lb 6.4 oz (85.458 kg)  BMI 31.35 kg/m2 General:   Alert,   pleasant and cooperative in NAD Skin:  Intact without significant lesions or rashes. Neck:  Supple; no masses or thyromegaly. No significant cervical adenopathy. Lungs:  Clear throughout to auscultation.   No wheezes, crackles, or rhonchi. No acute distress. Heart:  Regular rate and rhythm; no murmurs, clicks, rubs,  or gallops. Abdomen: Non-distended, normal bowel sounds.  Soft and nontender without appreciable mass or hepatosplenomegaly.  Pulses:  Normal pulses noted. Extremities:  Without clubbing or edema. Rectal:  Only moderate sphincter tone. No mass the rectal vault. No stool in rectal vault. Mucus is Hemoccult  negative   Impression:  Pleasant 82 year old lady with 24 history of what sounds like melena recently. No history whatsoever of Pepto-Bismol ingestion, green leafy vegetables, Kaopectate, iron etc. Occasional fecal incontinence at baseline. Mild abdominal "rumbling" from time to time. On Plavix. Occult negative today but her episode occurred a week ago. No recent blood work. If black stool was indeed melena, differential would be broad with any number of lesions in the upper GI tract, small bowel or even proximal colon.  She appears quite stable at this time. Differential diagnosis is broad. At this point, will be best to go ahead and offer her initial GI evaluation via obtaining a CBC in performing an EGD.  Recommendations:  We'll obtain a CBC today. Schedule a diagnostic EGD.The risks, benefits, limitations, alternatives and imponderables have been reviewed with the patient. Potential for esophageal dilation, biopsy, etc. have also been reviewed.  Questions have been answered. All parties agreeable.  She will remain  on Plavix for this procedure. Further recommendations to follow. I explained to the patient and did an EGD may or may not explain her recent illness. Further evaluation may be needed.       Notice: This dictation was prepared with Dragon dictation along with smaller phrase technology. Any transcriptional errors that result from this process are unintentional and may not be corrected upon review.

## 2015-04-26 ENCOUNTER — Ambulatory Visit: Payer: Medicare Other | Admitting: Internal Medicine

## 2015-05-08 ENCOUNTER — Ambulatory Visit (HOSPITAL_COMMUNITY)
Admission: RE | Admit: 2015-05-08 | Discharge: 2015-05-08 | Disposition: A | Discharge status: Home / Self Care | Payer: Medicare Other | Source: Ambulatory Visit | Attending: Internal Medicine | Admitting: Internal Medicine

## 2015-05-08 ENCOUNTER — Encounter (HOSPITAL_COMMUNITY): Payer: Self-pay | Admitting: *Deleted

## 2015-05-08 ENCOUNTER — Encounter (HOSPITAL_COMMUNITY)
Admission: RE | Disposition: A | Discharge status: Home / Self Care | Payer: Self-pay | Source: Ambulatory Visit | Attending: Internal Medicine

## 2015-05-08 DIAGNOSIS — K449 Diaphragmatic hernia without obstruction or gangrene: Secondary | ICD-10-CM | POA: Diagnosis not present

## 2015-05-08 DIAGNOSIS — Z7902 Long term (current) use of antithrombotics/antiplatelets: Secondary | ICD-10-CM | POA: Insufficient documentation

## 2015-05-08 DIAGNOSIS — Z7951 Long term (current) use of inhaled steroids: Secondary | ICD-10-CM | POA: Insufficient documentation

## 2015-05-08 DIAGNOSIS — Z86010 Personal history of colonic polyps: Secondary | ICD-10-CM | POA: Diagnosis not present

## 2015-05-08 DIAGNOSIS — Z8673 Personal history of transient ischemic attack (TIA), and cerebral infarction without residual deficits: Secondary | ICD-10-CM | POA: Insufficient documentation

## 2015-05-08 DIAGNOSIS — K219 Gastro-esophageal reflux disease without esophagitis: Secondary | ICD-10-CM | POA: Diagnosis not present

## 2015-05-08 DIAGNOSIS — Z79899 Other long term (current) drug therapy: Secondary | ICD-10-CM | POA: Insufficient documentation

## 2015-05-08 DIAGNOSIS — Q394 Esophageal web: Secondary | ICD-10-CM

## 2015-05-08 DIAGNOSIS — K921 Melena: Secondary | ICD-10-CM | POA: Insufficient documentation

## 2015-05-08 DIAGNOSIS — E039 Hypothyroidism, unspecified: Secondary | ICD-10-CM | POA: Diagnosis not present

## 2015-05-08 DIAGNOSIS — K222 Esophageal obstruction: Secondary | ICD-10-CM | POA: Insufficient documentation

## 2015-05-08 HISTORY — PX: ESOPHAGOGASTRODUODENOSCOPY: SHX5428

## 2015-05-08 HISTORY — DX: Diaphragmatic hernia without obstruction or gangrene: K44.9

## 2015-05-08 HISTORY — DX: Esophageal obstruction: K22.2

## 2015-05-08 SURGERY — EGD (ESOPHAGOGASTRODUODENOSCOPY)
Anesthesia: Moderate Sedation

## 2015-05-08 MED ORDER — MEPERIDINE HCL 100 MG/ML IJ SOLN
INTRAMUSCULAR | Status: AC
  Filled 2015-05-08: qty 2

## 2015-05-08 MED ORDER — MEPERIDINE HCL 100 MG/ML IJ SOLN
INTRAMUSCULAR | Status: DC | PRN
  Administered 2015-05-08 (×2): 25 mg via INTRAVENOUS

## 2015-05-08 MED ORDER — MIDAZOLAM HCL 5 MG/5ML IJ SOLN
INTRAMUSCULAR | Status: DC | PRN
  Administered 2015-05-08: 2 mg via INTRAVENOUS
  Administered 2015-05-08 (×2): 1 mg via INTRAVENOUS

## 2015-05-08 MED ORDER — MIDAZOLAM HCL 5 MG/5ML IJ SOLN
INTRAMUSCULAR | Status: AC
  Filled 2015-05-08: qty 10

## 2015-05-08 MED ORDER — STERILE WATER FOR IRRIGATION IR SOLN
Status: DC | PRN
  Administered 2015-05-08: 13:00:00

## 2015-05-08 MED ORDER — ONDANSETRON HCL 4 MG/2ML IJ SOLN
INTRAMUSCULAR | Status: AC
  Filled 2015-05-08: qty 2

## 2015-05-08 MED ORDER — LIDOCAINE VISCOUS 2 % MT SOLN
OROMUCOSAL | Status: AC
  Filled 2015-05-08: qty 15

## 2015-05-08 MED ORDER — ONDANSETRON HCL 4 MG/2ML IJ SOLN
INTRAMUSCULAR | Status: DC | PRN
  Administered 2015-05-08: 4 mg via INTRAVENOUS

## 2015-05-08 MED ORDER — LIDOCAINE VISCOUS 2 % MT SOLN
OROMUCOSAL | Status: DC | PRN
  Administered 2015-05-08: 3 mL via OROMUCOSAL

## 2015-05-08 MED ORDER — SODIUM CHLORIDE 0.9 % IV SOLN
INTRAVENOUS | Status: DC
  Administered 2015-05-08: 1000 mL via INTRAVENOUS

## 2015-05-08 NOTE — H&P (View-Only) (Signed)
Primary Care Physician:  Tonye Becket Primary Gastroenterologist:  Dr. Gala Romney  Pre-Procedure History & Physical: HPI:  Alexandria Jordan is a 82 y.o. female here for because of a 24 history of black tarry stools with some incontinence which occurred a week ago. States she really wasn't having abdominal pain;  had multiple dark/ black stools. No recent Pepto-Bismol. Reflux symptoms well controlled on Protonix 40 mg daily; no dysphagia. Takes Plavix daily  - no other antiplatelet or anticoagulation therapy.  Denies aspirin or other NSAIDs.  Last EGD was back in 2004; noncritical Schatzki ring, hiatal hernia. Last colonoscopy 2013. Diverticulosis and colonic adenoma removed. Denies frank abdominal pain but does complain of abdominal "rumbling"  Hasn't had any hematochezia. No recent labs.  History of aortic dissection managed conservatively. She's not had any major vascular surgery.  Patient denies hematochezia, diarrhea. Takes a probiotic sporadically. Last colonoscopy 2013 diverticulosis and colonic adenoma.  On Plavix chronically.  Past Medical History  Diagnosis Date  . GERD (gastroesophageal reflux disease)   . Hx of adenomatous colonic polyps   . TIA (transient ischemic attack)   . Hypothyroidism   . Adenoma 08/19/2006    tubular adenoma  . Diverticulosis   . Hemorrhoids   . Stroke [facility])     Past Surgical History  Procedure Laterality Date  . Colonoscopy  07/2006    tubular adenoma, , left-sided diverticula  . Esophagogastroduodenoscopy  01/2002    schatzki ring (not manipulated), small to moderate hh  . Hysterectomy for endometriosis and fibroids  1978  . Bladder tack  2001  . Colonoscopy  06/17/2011    Dr. Gala Romney- colonic diverticulosis, colonic polyps-tubular adenoma  . Abdominal hysterectomy    . Cesarean section      Prior to Admission medications   Medication Sig Start Date End Date Taking? Authorizing Provider  albuterol (PROVENTIL HFA;VENTOLIN HFA) 108 (90 BASE)  MCG/ACT inhaler Inhale 2 puffs into the lungs every 6 (six) hours as needed. For shortness of breath   Yes Historical Provider, MD  Azelaic Acid (FINACEA) 15 % cream Apply 1 application topically 2 (two) times daily. After skin is thoroughly washed and patted dry, gently but thoroughly massage a thin film of azelaic acid cream into the affected area twice daily, in the morning and evening.   Yes Historical Provider, MD  Calcium Carbonate-Vit D-Min (CALTRATE 600+D PLUS) 600-400 MG-UNIT per tablet Take 1 tablet by mouth every morning.   Yes Historical Provider, MD  cholecalciferol (VITAMIN D) 1000 UNITS tablet Take 1,000 Units by mouth daily.   Yes Historical Provider, MD  clobetasol cream (TEMOVATE) 0.05 %  10/04/13  Yes Historical Provider, MD  clopidogrel (PLAVIX) 75 MG tablet Take 1 tablet by mouth daily. 05/18/12  Yes Historical Provider, MD  DETROL LA 4 MG 24 hr capsule Take 4 mg by mouth every morning.  05/13/11  Yes Historical Provider, MD  Digestive Enzymes (DIGESTIVE SUPPORT PO) Take by mouth daily.   Yes Historical Provider, MD  fluticasone (FLONASE) 50 MCG/ACT nasal spray Place 2 sprays into the nose at bedtime.   Yes Historical Provider, MD  folic acid (FOLVITE) 1 MG tablet Take 800 mg by mouth every morning.    Yes Historical Provider, MD  gabapentin (NEURONTIN) 100 MG capsule Take 100 mg by mouth at bedtime.   Yes Historical Provider, MD  losartan (COZAAR) 25 MG tablet Take by mouth daily.   Yes Historical Provider, MD  metroNIDAZOLE (METROCREAM) 0.75 % cream  10/04/13  Yes  Historical Provider, MD  oxybutynin (DITROPAN-XL) 10 MG 24 hr tablet Take 10 mg by mouth daily.  08/10/12  Yes Historical Provider, MD  pantoprazole (PROTONIX) 40 MG tablet Take 1 tablet by mouth daily. 06/13/12  Yes Historical Provider, MD  Sulfacetamide Sodium-Sulfur 10-2 % CREA Apply 1 application topically every morning.   Yes Historical Provider, MD  SYNTHROID 125 MCG tablet Take 125 mcg by mouth daily before breakfast.   05/12/12  Yes Historical Provider, MD  Thiamine HCl (VITAMIN B-1) 100 MG tablet Take 1,000 mg by mouth every morning.    Yes Historical Provider, MD  triamcinolone cream (KENALOG) 0.1 % Apply 1 application topically.  07/07/12  Yes Historical Provider, MD  vitamin B-12 (CYANOCOBALAMIN) 1000 MCG tablet Take 1,000 mcg by mouth every morning.    Yes Historical Provider, MD    Allergies as of 04/23/2015 - Review Complete 04/23/2015  Allergen Reaction Noted  . Adhesive [tape] Rash 06/15/2011  . Fulvicin u-f [griseofulvin] Rash 05/22/2010    Family History  Problem Relation Age of Onset  . Diabetes Mother   . Other Mother     varicose veins    Social History   Social History  . Marital Status: Widowed    Spouse Name: N/A  . Number of Children: N/A  . Years of Education: N/A   Occupational History  . Not on file.   Social History Main Topics  . Smoking status: Never Smoker   . Smokeless tobacco: Never Used  . Alcohol Use: No  . Drug Use: No  . Sexual Activity: Not on file   Other Topics Concern  . Not on file   Social History Narrative    Review of Systems: See HPI, otherwise negative ROS  Physical Exam: BP 136/91 mmHg  Pulse 94  Temp(Src) 98.4 F (36.9 C)  Ht 5\' 5"  (1.651 m)  Wt 188 lb 6.4 oz (85.458 kg)  BMI 31.35 kg/m2 General:   Alert,   pleasant and cooperative in NAD Skin:  Intact without significant lesions or rashes. Neck:  Supple; no masses or thyromegaly. No significant cervical adenopathy. Lungs:  Clear throughout to auscultation.   No wheezes, crackles, or rhonchi. No acute distress. Heart:  Regular rate and rhythm; no murmurs, clicks, rubs,  or gallops. Abdomen: Non-distended, normal bowel sounds.  Soft and nontender without appreciable mass or hepatosplenomegaly.  Pulses:  Normal pulses noted. Extremities:  Without clubbing or edema. Rectal:  Only moderate sphincter tone. No mass the rectal vault. No stool in rectal vault. Mucus is Hemoccult  negative   Impression:  Pleasant 82 year old lady with 24 history of what sounds like melena recently. No history whatsoever of Pepto-Bismol ingestion, green leafy vegetables, Kaopectate, iron etc. Occasional fecal incontinence at baseline. Mild abdominal "rumbling" from time to time. On Plavix. Occult negative today but her episode occurred a week ago. No recent blood work. If black stool was indeed melena, differential would be broad with any number of lesions in the upper GI tract, small bowel or even proximal colon.  She appears quite stable at this time. Differential diagnosis is broad. At this point, will be best to go ahead and offer her initial GI evaluation via obtaining a CBC in performing an EGD.  Recommendations:  We'll obtain a CBC today. Schedule a diagnostic EGD.The risks, benefits, limitations, alternatives and imponderables have been reviewed with the patient. Potential for esophageal dilation, biopsy, etc. have also been reviewed.  Questions have been answered. All parties agreeable.  She will remain  on Plavix for this procedure. Further recommendations to follow. I explained to the patient and did an EGD may or may not explain her recent illness. Further evaluation may be needed.       Notice: This dictation was prepared with Dragon dictation along with smaller phrase technology. Any transcriptional errors that result from this process are unintentional and may not be corrected upon review.

## 2015-05-08 NOTE — Discharge Instructions (Signed)
EGD Discharge instructions Please read the instructions outlined below and refer to this sheet in the next few weeks. These discharge instructions provide you with general information on caring for yourself after you leave the hospital. Your doctor may also give you specific instructions. While your treatment has been planned according to the most current medical practices available, unavoidable complications occasionally occur. If you have any problems or questions after discharge, please call your doctor. ACTIVITY  You may resume your regular activity but move at a slower pace for the next 24 hours.   Take frequent rest periods for the next 24 hours.   Walking will help expel (get rid of) the air and reduce the bloated feeling in your abdomen.   No driving for 24 hours (because of the anesthesia (medicine) used during the test).   You may shower.   Do not sign any important legal documents or operate any machinery for 24 hours (because of the anesthesia used during the test).  NUTRITION  Drink plenty of fluids.   You may resume your normal diet.   Begin with a light meal and progress to your normal diet.   Avoid alcoholic beverages for 24 hours or as instructed by your caregiver.  MEDICATIONS  You may resume your normal medications unless your caregiver tells you otherwise.  WHAT YOU CAN EXPECT TODAY  You may experience abdominal discomfort such as a feeling of fullness or gas pains.  FOLLOW-UP  Your doctor will discuss the results of your test with you.  SEEK IMMEDIATE MEDICAL ATTENTION IF ANY OF THE FOLLOWING OCCUR:  Excessive nausea (feeling sick to your stomach) and/or vomiting.   Severe abdominal pain and distention (swelling).   Trouble swallowing.   Temperature over 101 F (37.8 C).   Rectal bleeding or vomiting of blood.    GERD and hiatal hernia information provided  Continue Protonix 40 mg daily  Continue with a serving of yogurt daily-if this  controls gas issues, no further action needed. If it does not, I would switch over to taking digestive advantage daily  Office visit in 6 months     Gastroesophageal Reflux Disease, Adult Normally, food travels down the esophagus and stays in the stomach to be digested. However, when a person has gastroesophageal reflux disease (GERD), food and stomach acid move back up into the esophagus. When this happens, the esophagus becomes sore and inflamed. Over time, GERD can create small holes (ulcers) in the lining of the esophagus.  CAUSES This condition is caused by a problem with the muscle between the esophagus and the stomach (lower esophageal sphincter, or LES). Normally, the LES muscle closes after food passes through the esophagus to the stomach. When the LES is weakened or abnormal, it does not close properly, and that allows food and stomach acid to go back up into the esophagus. The LES can be weakened by certain dietary substances, medicines, and medical conditions, including:  Tobacco use.  Pregnancy.  Having a hiatal hernia.  Heavy alcohol use.  Certain foods and beverages, such as coffee, chocolate, onions, and peppermint. RISK FACTORS This condition is more likely to develop in:  People who have an increased body weight.  People who have connective tissue disorders.  People who use NSAID medicines. SYMPTOMS Symptoms of this condition include:  Heartburn.  Difficult or painful swallowing.  The feeling of having a lump in the throat.  Abitter taste in the mouth.  Bad breath.  Having a large amount of saliva.  Having  an upset or bloated stomach.  Belching.  Chest pain.  Shortness of breath or wheezing.  Ongoing (chronic) cough or a night-time cough.  Wearing away of tooth enamel.  Weight loss. Different conditions can cause chest pain. Make sure to see your health care provider if you experience chest pain. DIAGNOSIS Your health care provider will  take a medical history and perform a physical exam. To determine if you have mild or severe GERD, your health care provider may also monitor how you respond to treatment. You may also have other tests, including:  An endoscopy toexamine your stomach and esophagus with a small camera.  A test thatmeasures the acidity level in your esophagus.  A test thatmeasures how much pressure is on your esophagus.  A barium swallow or modified barium swallow to show the shape, size, and functioning of your esophagus. TREATMENT The goal of treatment is to help relieve your symptoms and to prevent complications. Treatment for this condition may vary depending on how severe your symptoms are. Your health care provider may recommend:  Changes to your diet.  Medicine.  Surgery. HOME CARE INSTRUCTIONS Diet  Follow a diet as recommended by your health care provider. This may involve avoiding foods and drinks such as:  Coffee and tea (with or without caffeine).  Drinks that containalcohol.  Energy drinks and sports drinks.  Carbonated drinks or sodas.  Chocolate and cocoa.  Peppermint and mint flavorings.  Garlic and onions.  Horseradish.  Spicy and acidic foods, including peppers, chili powder, curry powder, vinegar, hot sauces, and barbecue sauce.  Citrus fruit juices and citrus fruits, such as oranges, lemons, and limes.  Tomato-based foods, such as red sauce, chili, salsa, and pizza with red sauce.  Fried and fatty foods, such as donuts, french fries, potato chips, and high-fat dressings.  High-fat meats, such as hot dogs and fatty cuts of red and white meats, such as rib eye steak, sausage, ham, and bacon.  High-fat dairy items, such as whole milk, butter, and cream cheese.  Eat small, frequent meals instead of large meals.  Avoid drinking large amounts of liquid with your meals.  Avoid eating meals during the 2-3 hours before bedtime.  Avoid lying down right after you  eat.  Do not exercise right after you eat. General Instructions  Pay attention to any changes in your symptoms.  Take over-the-counter and prescription medicines only as told by your health care provider. Do not take aspirin, ibuprofen, or other NSAIDs unless your health care provider told you to do so.  Do not use any tobacco products, including cigarettes, chewing tobacco, and e-cigarettes. If you need help quitting, ask your health care provider.  Wear loose-fitting clothing. Do not wear anything tight around your waist that causes pressure on your abdomen.  Raise (elevate) the head of your bed 6 inches (15cm).  Try to reduce your stress, such as with yoga or meditation. If you need help reducing stress, ask your health care provider.  If you are overweight, reduce your weight to an amount that is healthy for you. Ask your health care provider for guidance about a safe weight loss goal.  Keep all follow-up visits as told by your health care provider. This is important. SEEK MEDICAL CARE IF:  You have new symptoms.  You have unexplained weight loss.  You have difficulty swallowing, or it hurts to swallow.  You have wheezing or a persistent cough.  Your symptoms do not improve with treatment.  You have a  hoarse voice. SEEK IMMEDIATE MEDICAL CARE IF:  You have pain in your arms, neck, jaw, teeth, or back.  You feel sweaty, dizzy, or light-headed.  You have chest pain or shortness of breath.  You vomit and your vomit looks like blood or coffee grounds.  You faint.  Your stool is bloody or black.  You cannot swallow, drink, or eat.   This information is not intended to replace advice given to you by your health care provider. Make sure you discuss any questions you have with your health care provider.   Document Released: 10/22/2004 Document Revised: 10/03/2014 Document Reviewed: 05/09/2014 Elsevier Interactive Patient Education 2016 [employer]   Hiatal  Hernia A hiatal hernia occurs when part of your stomach slides above the muscle that separates your abdomen from your chest (diaphragm). You can be born with a hiatal hernia (congenital), or it may develop over time. In almost all cases of hiatal hernia, only the top part of the stomach pushes through.  Many people have a hiatal hernia with no symptoms. The larger the hernia, the more likely that you will have symptoms. In some cases, a hiatal hernia allows stomach acid to flow back into the tube that carries food from your mouth to your stomach (esophagus). This may cause heartburn symptoms. Severe heartburn symptoms may mean you have developed a condition called gastroesophageal reflux disease (GERD).  CAUSES  Hiatal hernias are caused by a weakness in the opening (hiatus) where your esophagus passes through your diaphragm to attach to the upper part of your stomach. You may be born with a weakness in your hiatus, or a weakness can develop. RISK FACTORS Older age is a major risk factor for a hiatal hernia. Anything that increases pressure on your diaphragm can also increase your risk of a hiatal hernia. This includes:  Pregnancy.  Excess weight.  Frequent constipation. SIGNS AND SYMPTOMS  People with a hiatal hernia often have no symptoms. If symptoms develop, they are almost always caused by GERD. They may include:  Heartburn.  Belching.  Indigestion.  Trouble swallowing.  Coughing or wheezing.  Sore throat.  Hoarseness.  Chest pain. DIAGNOSIS  A hiatal hernia is sometimes found during an exam for another problem. Your health care provider may suspect a hiatal hernia if you have symptoms of GERD. Tests may be done to diagnose GERD. These may include:  X-rays of your stomach or chest.  An upper gastrointestinal (GI) series. This is an X-ray exam of your GI tract involving the use of a chalky liquid that you swallow. The liquid shows up clearly on the X-ray.  Endoscopy. This  is a procedure to look into your stomach using a thin, flexible tube that has a tiny camera and light on the end of it. TREATMENT  If you have no symptoms, you may not need treatment. If you have symptoms, treatment may include:  Dietary and lifestyle changes to help reduce GERD symptoms.  Medicines. These may include:  Over-the-counter antacids.  Medicines that make your stomach empty more quickly.  Medicines that block the production of stomach acid (H2 blockers).  Stronger medicines to reduce stomach acid (proton pump inhibitors).  You may need surgery to repair the hernia if other treatments are not helping. HOME CARE INSTRUCTIONS   Take all medicines as directed by your health care provider.  Quit smoking, if you smoke.  Try to achieve and maintain a healthy body weight.  Eat frequent small meals instead of three large meals a  day. This keeps your stomach from getting too full.  Eat slowly.  Do not lie down right after eating.  Do noteat 1-2 hours before bed.   Do not drink beverages with caffeine. These include cola, coffee, cocoa, and tea.  Do not drink alcohol.  Avoid foods that can make symptoms of GERD worse. These may include:  Fatty foods.  Citrus fruits.  Other foods and drinks that contain acid.  Avoid putting pressure on your belly. Anything that puts pressure on your belly increases the amount of acid that may be pushed up into your esophagus.   Avoid bending over, especially after eating.  Raise the head of your bed by putting blocks under the legs. This keeps your head and esophagus higher than your stomach.  Do not wear tight clothing around your chest or stomach.  Try not to strain when having a bowel movement, when urinating, or when lifting heavy objects. SEEK MEDICAL CARE IF:  Your symptoms are not controlled with medicines or lifestyle changes.  You are having trouble swallowing.  You have coughing or wheezing that will not go  away. SEEK IMMEDIATE MEDICAL CARE IF:  Your pain is getting worse.  Your pain spreads to your arms, neck, jaw, teeth, or back.  You have shortness of breath.  You sweat for no reason.  You feel sick to your stomach (nauseous) or vomit.  You vomit blood.  You have bright red blood in your stools.  You have black, tarry stools.    This information is not intended to replace advice given to you by your health care provider. Make sure you discuss any questions you have with your health care provider.   Document Released: 04/04/2003 Document Revised: 02/02/2014 Document Reviewed: 12/30/2012 Elsevier Interactive Patient Education [employer].

## 2015-05-08 NOTE — Interval H&P Note (Signed)
History and Physical Interval Note:  05/08/2015 1:15 PM  Alexandria Jordan  has presented today for surgery, with the diagnosis of black stools/melena  The various methods of treatment have been discussed with the patient and family. After consideration of risks, benefits and other options for treatment, the patient has consented to  Procedure(s) with comments: ESOPHAGOGASTRODUODENOSCOPY (EGD) (N/A) - 145 as a surgical intervention .  The patient's history has been reviewed, patient examined, no change in status, stable for surgery.  I have reviewed the patient's chart and labs.  Questions were answered to the patient's satisfaction.     Robert Rourk  No dysphagia. No more melena. Hemoglobin normal. Diagnostic EGD per plan.The risks, benefits, limitations, alternatives and imponderables have been reviewed with the patient. Potential for esophageal dilation, biopsy, etc. have also been reviewed.  Questions have been answered. All parties agreeable.

## 2015-05-08 NOTE — Interval H&P Note (Signed)
History and Physical Interval Note:  05/08/2015 1:14 PM  Alexandria Jordan  has presented today for surgery, with the diagnosis of black stools/melena  The various methods of treatment have been discussed with the patient and family. After consideration of risks, benefits and other options for treatment, the patient has consented to  Procedure(s) with comments: ESOPHAGOGASTRODUODENOSCOPY (EGD) (N/A) - 145 as a surgical intervention .  The patient's history has been reviewed, patient examined, no change in status, stable for surgery.  I have reviewed the patient's chart and labs.  Questions were answered to the patient's satisfaction.     Robert Rourk  Hemoglobin normal. No further melena. Diagnostic EGD per plan. Patient denies dysphagia. EGD Discharge instructions Please read the instructions outlined below and refer to this sheet in the next few weeks. These discharge instructions provide you with general information on caring for yourself after you leave the hospital. Your doctor may also give you specific instructions. While your treatment has been planned according to the most current medical practices available, unavoidable complications occasionally occur. If you have any problems or questions after discharge, please call your doctor. ACTIVITY  You may resume your regular activity but move at a slower pace for the next 24 hours.   Take frequent rest periods for the next 24 hours.   Walking will help expel (get rid of) the air and reduce the bloated feeling in your abdomen.   No driving for 24 hours (because of the anesthesia (medicine) used during the test).   You may shower.   Do not sign any important legal documents or operate any machinery for 24 hours (because of the anesthesia used during the test).  NUTRITION  Drink plenty of fluids.   You may resume your normal diet.   Begin with a light meal and progress to your normal diet.   Avoid alcoholic beverages for 24 hours or  as instructed by your caregiver.  MEDICATIONS  You may resume your normal medications unless your caregiver tells you otherwise.  WHAT YOU CAN EXPECT TODAY  You may experience abdominal discomfort such as a feeling of fullness or "gas" pains.  FOLLOW-UP  Your doctor will discuss the results of your test with you.  SEEK IMMEDIATE MEDICAL ATTENTION IF ANY OF THE FOLLOWING OCCUR:  Excessive nausea (feeling sick to your stomach) and/or vomiting.   Severe abdominal pain and distention (swelling).   Trouble swallowing.   Temperature over 101 F (37.8 C).   Rectal bleeding or vomiting of blood.

## 2015-05-08 NOTE — Op Note (Signed)
[facility] Patient Name: Alexandria Jordan Procedure Date: 05/08/2015 1:18 PM MRN: [id number] Date of Birth: [birth year] Attending MD: Norvel Richards , MD CSN: [id number] Age: 82 Admit Type: Outpatient Procedure:                Upper GI endoscopy Indications:              Melena Providers:                Norvel Richards, MD, Gwenlyn Fudge, RN, Isabella Stalling, Technician Referring MD:             Rory Percy M.D. Medicines:                Midazolam 4 mg IV, Meperidine 50 mg IV, Ondansetron                            4 mg IV Complications:            No immediate complications. Estimated Blood Loss:     Estimated blood loss: none. Procedure:                Pre-Anesthesia Assessment:                           - Prior to the procedure, a History and Physical                            was performed, and patient medications and                            allergies were reviewed. The patient's tolerance of                            previous anesthesia was also reviewed. The risks                            and benefits of the procedure and the sedation                            options and risks were discussed with the patient.                            All questions were answered, and informed consent                            was obtained. Anticoagulants: The patient has taken                            anticoagulant medication. It was decided not to                            withhold this medication prior to procedure. ASA  Grade Assessment: II - A patient with mild systemic                            disease. After reviewing the risks and benefits,                            the patient was deemed in satisfactory condition to                            undergo the procedure.                           After obtaining informed consent, the endoscope was                            passed under direct vision. Throughout  the                            procedure, the patient's blood pressure, pulse, and                            oxygen saturations were monitored continuously. The                            [id number]) scope was introduced through the                            mouth, and advanced to the second part of duodenum.                            The upper GI endoscopy was accomplished without                            difficulty. The patient tolerated the procedure                            well. The upper GI endoscopy was accomplished                            without difficulty. The patient tolerated the                            procedure well. Scope In: 1:28:24 PM Scope Out: 1:32:06 PM Total Procedure Duration: 0 hours 3 minutes 42 seconds  Findings:      A mild Schatzki ring - (noncritical) was found in the distal esophagus.      A medium-sized hiatal hernia was present.      The cardia and gastric fundus were normal on retroflexion.      The second portion of the duodenum was normal. Impression:               - Mild Schatzki ring.                           - Medium-sized hiatal hernia.                           -  Normal second portion of the duodenum.                           - No specimens collected. Self-limiting black                            stool. Hemoglobin remains normal in the 14 range.                            Trivial GI bleed cannot be excluded. Should                            currently asymptomatic. Moderate Sedation:      Moderate (conscious) sedation was administered by the endoscopy nurse       and supervised by the endoscopist. The following parameters were       monitored: oxygen saturation, heart rate, blood pressure, respiratory       rate, EKG, adequacy of pulmonary ventilation, and response to care.       Total physician intraservice time was 15 minutes. Recommendation:           - Patient has a contact number available for                             emergencies. The signs and symptoms of potential                            delayed complications were discussed with the                            patient. Return to normal activities tomorrow.                            Written discharge instructions were provided to the                            patient.                           - Advance diet as tolerated.                           - Continue present medications including Protonix                            and Plavix..                           - Return to my office in 6 months. If there is                            future evidence of ongoing GI bleeding, further                            evaluation would be warranted. Procedure Code(s):        --- Professional ---  [id number], Esophagogastroduodenoscopy, flexible,                            transoral; diagnostic, including collection of                            specimen(s) by brushing or washing, when performed                            (separate procedure)                           99152, Moderate sedation services provided by the                            same physician or other qualified health care                            professional performing the diagnostic or                            therapeutic service that the sedation supports,                            requiring the presence of an independent trained                            observer to assist in the monitoring of the                            patient's level of consciousness and physiological                            status; initial 15 minutes of intraservice time,                            patient age 22 years or older Diagnosis Code(s):        --- Professional ---                           K22.2, Esophageal obstruction                           K44.9, Diaphragmatic hernia without obstruction or                            gangrene                           K92.1, Melena  (includes Hematochezia) CPT copyright 2016 [employer]. All rights reserved. The codes documented in this report are preliminary and upon coder review may  be revised to meet current compliance requirements. Cristopher Estimable. Yamilette Garretson, MD Norvel Richards, MD 05/08/2015 1:44:27 PM This report has been signed electronically. Number of Addenda: 0

## 2015-05-09 ENCOUNTER — Encounter (HOSPITAL_COMMUNITY): Payer: Self-pay | Admitting: Internal Medicine

## 2015-07-08 DIAGNOSIS — G459 Transient cerebral ischemic attack, unspecified: Secondary | ICD-10-CM | POA: Diagnosis not present

## 2015-07-08 DIAGNOSIS — R739 Hyperglycemia, unspecified: Secondary | ICD-10-CM | POA: Diagnosis not present

## 2015-07-08 DIAGNOSIS — R609 Edema, unspecified: Secondary | ICD-10-CM | POA: Diagnosis not present

## 2015-07-08 DIAGNOSIS — E039 Hypothyroidism, unspecified: Secondary | ICD-10-CM | POA: Diagnosis not present

## 2015-07-08 DIAGNOSIS — K219 Gastro-esophageal reflux disease without esophagitis: Secondary | ICD-10-CM | POA: Diagnosis not present

## 2015-07-08 DIAGNOSIS — I10 Essential (primary) hypertension: Secondary | ICD-10-CM | POA: Diagnosis not present

## 2015-07-15 DIAGNOSIS — R05 Cough: Secondary | ICD-10-CM | POA: Diagnosis not present

## 2015-07-15 DIAGNOSIS — R2681 Unsteadiness on feet: Secondary | ICD-10-CM | POA: Diagnosis not present

## 2015-07-15 DIAGNOSIS — E039 Hypothyroidism, unspecified: Secondary | ICD-10-CM | POA: Diagnosis not present

## 2015-07-15 DIAGNOSIS — I10 Essential (primary) hypertension: Secondary | ICD-10-CM | POA: Diagnosis not present

## 2015-07-15 DIAGNOSIS — G459 Transient cerebral ischemic attack, unspecified: Secondary | ICD-10-CM | POA: Diagnosis not present

## 2015-07-15 DIAGNOSIS — K219 Gastro-esophageal reflux disease without esophagitis: Secondary | ICD-10-CM | POA: Diagnosis not present

## 2015-07-18 DIAGNOSIS — J328 Other chronic sinusitis: Secondary | ICD-10-CM | POA: Diagnosis not present

## 2015-07-18 DIAGNOSIS — Z8673 Personal history of transient ischemic attack (TIA), and cerebral infarction without residual deficits: Secondary | ICD-10-CM | POA: Diagnosis not present

## 2015-07-18 DIAGNOSIS — R269 Unspecified abnormalities of gait and mobility: Secondary | ICD-10-CM | POA: Diagnosis not present

## 2015-07-18 DIAGNOSIS — G319 Degenerative disease of nervous system, unspecified: Secondary | ICD-10-CM | POA: Diagnosis not present

## 2015-07-24 DIAGNOSIS — G459 Transient cerebral ischemic attack, unspecified: Secondary | ICD-10-CM | POA: Diagnosis not present

## 2015-07-24 DIAGNOSIS — R296 Repeated falls: Secondary | ICD-10-CM | POA: Diagnosis not present

## 2015-07-24 DIAGNOSIS — R42 Dizziness and giddiness: Secondary | ICD-10-CM | POA: Diagnosis not present

## 2015-07-24 DIAGNOSIS — R2681 Unsteadiness on feet: Secondary | ICD-10-CM | POA: Diagnosis not present

## 2015-08-01 DIAGNOSIS — G459 Transient cerebral ischemic attack, unspecified: Secondary | ICD-10-CM | POA: Diagnosis not present

## 2015-08-01 DIAGNOSIS — R296 Repeated falls: Secondary | ICD-10-CM | POA: Diagnosis not present

## 2015-08-01 DIAGNOSIS — R2681 Unsteadiness on feet: Secondary | ICD-10-CM | POA: Diagnosis not present

## 2015-08-01 DIAGNOSIS — R42 Dizziness and giddiness: Secondary | ICD-10-CM | POA: Diagnosis not present

## 2015-08-06 DIAGNOSIS — R42 Dizziness and giddiness: Secondary | ICD-10-CM | POA: Diagnosis not present

## 2015-08-06 DIAGNOSIS — R2681 Unsteadiness on feet: Secondary | ICD-10-CM | POA: Diagnosis not present

## 2015-08-06 DIAGNOSIS — R296 Repeated falls: Secondary | ICD-10-CM | POA: Diagnosis not present

## 2015-08-06 DIAGNOSIS — G459 Transient cerebral ischemic attack, unspecified: Secondary | ICD-10-CM | POA: Diagnosis not present

## 2015-08-08 DIAGNOSIS — R296 Repeated falls: Secondary | ICD-10-CM | POA: Diagnosis not present

## 2015-08-08 DIAGNOSIS — G459 Transient cerebral ischemic attack, unspecified: Secondary | ICD-10-CM | POA: Diagnosis not present

## 2015-08-08 DIAGNOSIS — R2681 Unsteadiness on feet: Secondary | ICD-10-CM | POA: Diagnosis not present

## 2015-08-08 DIAGNOSIS — R42 Dizziness and giddiness: Secondary | ICD-10-CM | POA: Diagnosis not present

## 2015-08-12 DIAGNOSIS — G459 Transient cerebral ischemic attack, unspecified: Secondary | ICD-10-CM | POA: Diagnosis not present

## 2015-08-12 DIAGNOSIS — R296 Repeated falls: Secondary | ICD-10-CM | POA: Diagnosis not present

## 2015-08-12 DIAGNOSIS — R42 Dizziness and giddiness: Secondary | ICD-10-CM | POA: Diagnosis not present

## 2015-08-12 DIAGNOSIS — R2681 Unsteadiness on feet: Secondary | ICD-10-CM | POA: Diagnosis not present

## 2015-08-14 DIAGNOSIS — G459 Transient cerebral ischemic attack, unspecified: Secondary | ICD-10-CM | POA: Diagnosis not present

## 2015-08-14 DIAGNOSIS — R2681 Unsteadiness on feet: Secondary | ICD-10-CM | POA: Diagnosis not present

## 2015-08-14 DIAGNOSIS — R296 Repeated falls: Secondary | ICD-10-CM | POA: Diagnosis not present

## 2015-08-14 DIAGNOSIS — R42 Dizziness and giddiness: Secondary | ICD-10-CM | POA: Diagnosis not present

## 2015-08-19 DIAGNOSIS — G459 Transient cerebral ischemic attack, unspecified: Secondary | ICD-10-CM | POA: Diagnosis not present

## 2015-08-19 DIAGNOSIS — R2681 Unsteadiness on feet: Secondary | ICD-10-CM | POA: Diagnosis not present

## 2015-08-19 DIAGNOSIS — R296 Repeated falls: Secondary | ICD-10-CM | POA: Diagnosis not present

## 2015-08-19 DIAGNOSIS — R42 Dizziness and giddiness: Secondary | ICD-10-CM | POA: Diagnosis not present

## 2015-08-21 DIAGNOSIS — R296 Repeated falls: Secondary | ICD-10-CM | POA: Diagnosis not present

## 2015-08-21 DIAGNOSIS — R2681 Unsteadiness on feet: Secondary | ICD-10-CM | POA: Diagnosis not present

## 2015-08-21 DIAGNOSIS — G459 Transient cerebral ischemic attack, unspecified: Secondary | ICD-10-CM | POA: Diagnosis not present

## 2015-08-21 DIAGNOSIS — R42 Dizziness and giddiness: Secondary | ICD-10-CM | POA: Diagnosis not present

## 2015-08-26 DIAGNOSIS — R42 Dizziness and giddiness: Secondary | ICD-10-CM | POA: Diagnosis not present

## 2015-08-26 DIAGNOSIS — R2681 Unsteadiness on feet: Secondary | ICD-10-CM | POA: Diagnosis not present

## 2015-08-26 DIAGNOSIS — G459 Transient cerebral ischemic attack, unspecified: Secondary | ICD-10-CM | POA: Diagnosis not present

## 2015-08-26 DIAGNOSIS — R296 Repeated falls: Secondary | ICD-10-CM | POA: Diagnosis not present

## 2015-08-28 DIAGNOSIS — R42 Dizziness and giddiness: Secondary | ICD-10-CM | POA: Diagnosis not present

## 2015-08-28 DIAGNOSIS — R296 Repeated falls: Secondary | ICD-10-CM | POA: Diagnosis not present

## 2015-08-28 DIAGNOSIS — G459 Transient cerebral ischemic attack, unspecified: Secondary | ICD-10-CM | POA: Diagnosis not present

## 2015-08-28 DIAGNOSIS — R2681 Unsteadiness on feet: Secondary | ICD-10-CM | POA: Diagnosis not present

## 2015-09-02 DIAGNOSIS — R42 Dizziness and giddiness: Secondary | ICD-10-CM | POA: Diagnosis not present

## 2015-09-02 DIAGNOSIS — R296 Repeated falls: Secondary | ICD-10-CM | POA: Diagnosis not present

## 2015-09-02 DIAGNOSIS — G459 Transient cerebral ischemic attack, unspecified: Secondary | ICD-10-CM | POA: Diagnosis not present

## 2015-09-02 DIAGNOSIS — R2681 Unsteadiness on feet: Secondary | ICD-10-CM | POA: Diagnosis not present

## 2015-09-04 DIAGNOSIS — R42 Dizziness and giddiness: Secondary | ICD-10-CM | POA: Diagnosis not present

## 2015-09-04 DIAGNOSIS — G459 Transient cerebral ischemic attack, unspecified: Secondary | ICD-10-CM | POA: Diagnosis not present

## 2015-09-04 DIAGNOSIS — R296 Repeated falls: Secondary | ICD-10-CM | POA: Diagnosis not present

## 2015-09-04 DIAGNOSIS — R2681 Unsteadiness on feet: Secondary | ICD-10-CM | POA: Diagnosis not present

## 2015-09-09 DIAGNOSIS — R42 Dizziness and giddiness: Secondary | ICD-10-CM | POA: Diagnosis not present

## 2015-09-09 DIAGNOSIS — R296 Repeated falls: Secondary | ICD-10-CM | POA: Diagnosis not present

## 2015-09-09 DIAGNOSIS — G459 Transient cerebral ischemic attack, unspecified: Secondary | ICD-10-CM | POA: Diagnosis not present

## 2015-09-09 DIAGNOSIS — R2681 Unsteadiness on feet: Secondary | ICD-10-CM | POA: Diagnosis not present

## 2015-09-11 DIAGNOSIS — G459 Transient cerebral ischemic attack, unspecified: Secondary | ICD-10-CM | POA: Diagnosis not present

## 2015-09-11 DIAGNOSIS — R296 Repeated falls: Secondary | ICD-10-CM | POA: Diagnosis not present

## 2015-09-11 DIAGNOSIS — R2681 Unsteadiness on feet: Secondary | ICD-10-CM | POA: Diagnosis not present

## 2015-09-11 DIAGNOSIS — R42 Dizziness and giddiness: Secondary | ICD-10-CM | POA: Diagnosis not present

## 2015-09-16 DIAGNOSIS — R2681 Unsteadiness on feet: Secondary | ICD-10-CM | POA: Diagnosis not present

## 2015-09-16 DIAGNOSIS — G459 Transient cerebral ischemic attack, unspecified: Secondary | ICD-10-CM | POA: Diagnosis not present

## 2015-09-16 DIAGNOSIS — R42 Dizziness and giddiness: Secondary | ICD-10-CM | POA: Diagnosis not present

## 2015-09-16 DIAGNOSIS — R296 Repeated falls: Secondary | ICD-10-CM | POA: Diagnosis not present

## 2015-09-18 DIAGNOSIS — R42 Dizziness and giddiness: Secondary | ICD-10-CM | POA: Diagnosis not present

## 2015-09-18 DIAGNOSIS — R2681 Unsteadiness on feet: Secondary | ICD-10-CM | POA: Diagnosis not present

## 2015-09-18 DIAGNOSIS — G459 Transient cerebral ischemic attack, unspecified: Secondary | ICD-10-CM | POA: Diagnosis not present

## 2015-09-18 DIAGNOSIS — R296 Repeated falls: Secondary | ICD-10-CM | POA: Diagnosis not present

## 2015-09-25 ENCOUNTER — Encounter: Payer: Self-pay | Admitting: Internal Medicine

## 2015-09-26 ENCOUNTER — Other Ambulatory Visit: Payer: Self-pay

## 2015-10-02 DIAGNOSIS — L719 Rosacea, unspecified: Secondary | ICD-10-CM | POA: Diagnosis not present

## 2015-10-02 DIAGNOSIS — L219 Seborrheic dermatitis, unspecified: Secondary | ICD-10-CM | POA: Diagnosis not present

## 2015-10-02 DIAGNOSIS — L299 Pruritus, unspecified: Secondary | ICD-10-CM | POA: Diagnosis not present

## 2015-10-02 DIAGNOSIS — L659 Nonscarring hair loss, unspecified: Secondary | ICD-10-CM | POA: Diagnosis not present

## 2015-10-02 DIAGNOSIS — Z8589 Personal history of malignant neoplasm of other organs and systems: Secondary | ICD-10-CM | POA: Diagnosis not present

## 2015-10-15 DIAGNOSIS — I10 Essential (primary) hypertension: Secondary | ICD-10-CM | POA: Diagnosis not present

## 2015-10-15 DIAGNOSIS — R2681 Unsteadiness on feet: Secondary | ICD-10-CM | POA: Diagnosis not present

## 2015-10-15 DIAGNOSIS — Z6832 Body mass index (BMI) 32.0-32.9, adult: Secondary | ICD-10-CM | POA: Diagnosis not present

## 2015-10-15 DIAGNOSIS — G459 Transient cerebral ischemic attack, unspecified: Secondary | ICD-10-CM | POA: Diagnosis not present

## 2015-10-15 DIAGNOSIS — Z1389 Encounter for screening for other disorder: Secondary | ICD-10-CM | POA: Diagnosis not present

## 2015-10-15 DIAGNOSIS — M7061 Trochanteric bursitis, right hip: Secondary | ICD-10-CM | POA: Diagnosis not present

## 2015-10-15 DIAGNOSIS — M25561 Pain in right knee: Secondary | ICD-10-CM | POA: Diagnosis not present

## 2015-10-22 DIAGNOSIS — M1711 Unilateral primary osteoarthritis, right knee: Secondary | ICD-10-CM | POA: Diagnosis not present

## 2015-10-29 ENCOUNTER — Ambulatory Visit (INDEPENDENT_AMBULATORY_CARE_PROVIDER_SITE_OTHER): Payer: Medicare Other | Admitting: Internal Medicine

## 2015-10-29 ENCOUNTER — Encounter: Payer: Self-pay | Admitting: Internal Medicine

## 2015-10-29 VITALS — BP 175/97 | HR 85 | Temp 98.5°F | Ht 67.0 in | Wt 195.2 lb

## 2015-10-29 DIAGNOSIS — K219 Gastro-esophageal reflux disease without esophagitis: Secondary | ICD-10-CM | POA: Diagnosis not present

## 2015-10-29 DIAGNOSIS — R140 Abdominal distension (gaseous): Secondary | ICD-10-CM | POA: Diagnosis not present

## 2015-10-29 NOTE — Progress Notes (Signed)
Primary Care Physician:  Tonye Becket Primary Gastroenterologist:  Dr. Gala Romney  Pre-Procedure History & Physical: HPI:  Alexandria Jordan is a 82 y.o. female here for follow-up GERD and bloating/incontinence. Bloating/incontinence symptoms resolved with probiotic therapy. The reflux symptoms well controlled on pantoprazole. Bout of reported melena previously worked up with EGD-negative. Hemoglobin normal. Occasionally has some breakthrough reflux symptoms at night. Does not eat before going to bed. Asks if she can take Protonix before lunch rather than before breakfast. No dysphagia. She does have multiple non-GI complaints.  Past Medical History:  Diagnosis Date  . Adenoma 08/19/2006   tubular adenoma  . Diverticulosis   . GERD (gastroesophageal reflux disease)   . Hemorrhoids   . Hiatal hernia 05/08/2015  . Hx of adenomatous colonic polyps   . Hypothyroidism   . Schatzki's ring 05/08/2015  . Stroke ([place])   . TIA (transient ischemic attack)     Past Surgical History:  Procedure Laterality Date  . ABDOMINAL HYSTERECTOMY    . APPENDECTOMY    . bladder tack  2001  . CESAREAN SECTION    . COLONOSCOPY  07/2006   tubular adenoma, , left-sided diverticula  . COLONOSCOPY  06/17/2011   Dr. Gala Romney- colonic diverticulosis, colonic polyps-tubular adenoma  . ESOPHAGOGASTRODUODENOSCOPY  01/2002   schatzki ring (not manipulated), small to moderate hh  . ESOPHAGOGASTRODUODENOSCOPY N/A 05/08/2015   Dr.Cartier Washko- mild schatzki ring, medium sized hiatal hernia, normal second portion of the duodenum, no specimens collected.   . hysterectomy for endometriosis and fibroids  1978    Prior to Admission medications   Medication Sig Start Date End Date Taking? Authorizing Provider  albuterol (PROVENTIL HFA;VENTOLIN HFA) 108 (90 BASE) MCG/ACT inhaler Inhale 2 puffs into the lungs every 6 (six) hours as needed. For shortness of breath   Yes Historical Provider, MD  Azelaic Acid (FINACEA) 15 % cream Apply  1 application topically 2 (two) times daily. After skin is thoroughly washed and patted dry, gently but thoroughly massage a thin film of azelaic acid cream into the affected area twice daily, in the morning and evening.   Yes Historical Provider, MD  Calcium Carbonate-Vit D-Min (CALTRATE 600+D PLUS) 600-400 MG-UNIT per tablet Take 1 tablet by mouth every morning.   Yes Historical Provider, MD  cholecalciferol (VITAMIN D) 1000 UNITS tablet Take 1,000 Units by mouth daily.   Yes Historical Provider, MD  clobetasol cream (TEMOVATE) 0.05 %  10/04/13  Yes Historical Provider, MD  clopidogrel (PLAVIX) 75 MG tablet Take 1 tablet by mouth daily. 05/18/12  Yes Historical Provider, MD  DETROL LA 4 MG 24 hr capsule Take 4 mg by mouth every morning.  05/13/11  Yes Historical Provider, MD  Digestive Enzymes (DIGESTIVE SUPPORT PO) Take by mouth daily.   Yes Historical Provider, MD  fluticasone (FLONASE) 50 MCG/ACT nasal spray Place 2 sprays into the nose at bedtime.   Yes Historical Provider, MD  folic acid (FOLVITE) 1 MG tablet Take 800 mg by mouth every morning.    Yes Historical Provider, MD  gabapentin (NEURONTIN) 100 MG capsule Take 100 mg by mouth at bedtime.   Yes Historical Provider, MD  losartan (COZAAR) 25 MG tablet Take by mouth daily.   Yes Historical Provider, MD  metroNIDAZOLE (METROCREAM) 0.75 % cream  10/04/13  Yes Historical Provider, MD  oxybutynin (DITROPAN-XL) 10 MG 24 hr tablet Take 10 mg by mouth daily.  08/10/12  Yes Historical Provider, MD  pantoprazole (PROTONIX) 40 MG tablet Take 1 tablet by  mouth daily. 06/13/12  Yes Historical Provider, MD  Sulfacetamide Sodium-Sulfur 10-2 % CREA Apply 1 application topically every morning.   Yes Historical Provider, MD  SYNTHROID 125 MCG tablet Take 125 mcg by mouth daily before breakfast.  05/12/12  Yes Historical Provider, MD  Thiamine HCl (VITAMIN B-1) 100 MG tablet Take 1,000 mg by mouth every morning.    Yes Historical Provider, MD  triamcinolone cream  (KENALOG) 0.1 % Apply 1 application topically.  07/07/12  Yes Historical Provider, MD  vitamin B-12 (CYANOCOBALAMIN) 1000 MCG tablet Take 1,000 mcg by mouth every morning.    Yes Historical Provider, MD    Allergies as of 10/29/2015 - Review Complete 10/29/2015  Allergen Reaction Noted  . Adhesive [tape] Rash 06/15/2011  . Fulvicin u-f [griseofulvin] Rash 05/22/2010    Family History  Problem Relation Age of Onset  . Diabetes Mother   . Other Mother     varicose veins    Social History   Social History  . Marital status: Widowed    Spouse name: N/A  . Number of children: N/A  . Years of education: N/A   Occupational History  . Not on file.   Social History Main Topics  . Smoking status: Never Smoker  . Smokeless tobacco: Never Used  . Alcohol use No  . Drug use: No  . Sexual activity: Not on file   Other Topics Concern  . Not on file   Social History Narrative  . No narrative on file    Review of Systems: See HPI, otherwise negative ROS  Physical Exam: BP (!) 175/97   Pulse 85   Temp 98.5 F (36.9 C) (Oral)   Ht 5\' 7"  (1.702 m)   Wt 195 lb 3.2 oz (88.5 kg)   BMI 30.57 kg/m  General:   Alert,  Well-developed, well-nourished, pleasant and cooperative in NAD Skin:  Intact without significant lesions or rashes. Eyes:  Sclera clear, no icterus.   Conjunctiva pink. Ears:  Normal auditory acuity. Nose:  No deformity, discharge,  or lesions. Mouth:  No deformity or lesions. Neck:  Supple; no masses or thyromegaly. No significant cervical adenopathy. Lungs:  Clear throughout to auscultation.   No wheezes, crackles, or rhonchi. No acute distress. Heart:  Regular rate and rhythm; no murmurs, clicks, rubs,  or gallops. Abdomen: Non-distended, normal bowel sounds.  Soft and nontender without appreciable mass or hepatosplenomegaly.  Pulses:  Normal pulses noted. Extremities:  Without clubbing or edema.  Impression:  Pleasant 82 year old lady overall doing very well  GI standpoint these days. GERD symptoms well controlled-although breakthrough in the evening. Gas bloat  / incontinence resolved with probiotic therapy daily. Hemoglobin Normal when previously checked. History of melena but Hemoccult negative and negative findings on EGD.Marland Kitchen  Recommendations:   Continue pantoprazole 40 mg daily - take it before lunch lunch daily  Use Yogurt and / or Digestive Advantage daily to every other day.  Office visit with [place] in 1 year        Notice: This dictation was prepared with Dragon dictation along with smaller phrase technology. Any transcriptional errors that result from this process are unintentional and may not be corrected upon review.

## 2015-10-29 NOTE — Patient Instructions (Signed)
Continue pantoprazole 40 mg daily - take it before lunch lunch daily  Use Yogurt and / or Digestive Advantage daily to every other day.  Office visit with [place] in 1 year

## 2015-11-07 DIAGNOSIS — R269 Unspecified abnormalities of gait and mobility: Secondary | ICD-10-CM | POA: Diagnosis not present

## 2015-11-14 DIAGNOSIS — R2681 Unsteadiness on feet: Secondary | ICD-10-CM | POA: Diagnosis not present

## 2015-11-14 DIAGNOSIS — Z6832 Body mass index (BMI) 32.0-32.9, adult: Secondary | ICD-10-CM | POA: Diagnosis not present

## 2015-11-14 DIAGNOSIS — I10 Essential (primary) hypertension: Secondary | ICD-10-CM | POA: Diagnosis not present

## 2015-11-14 DIAGNOSIS — K219 Gastro-esophageal reflux disease without esophagitis: Secondary | ICD-10-CM | POA: Diagnosis not present

## 2015-11-14 DIAGNOSIS — Z23 Encounter for immunization: Secondary | ICD-10-CM | POA: Diagnosis not present

## 2015-11-14 DIAGNOSIS — G459 Transient cerebral ischemic attack, unspecified: Secondary | ICD-10-CM | POA: Diagnosis not present

## 2015-12-23 DIAGNOSIS — I10 Essential (primary) hypertension: Secondary | ICD-10-CM | POA: Diagnosis not present

## 2015-12-23 DIAGNOSIS — Z6832 Body mass index (BMI) 32.0-32.9, adult: Secondary | ICD-10-CM | POA: Diagnosis not present

## 2015-12-23 DIAGNOSIS — J209 Acute bronchitis, unspecified: Secondary | ICD-10-CM | POA: Diagnosis not present

## 2016-01-13 DIAGNOSIS — R739 Hyperglycemia, unspecified: Secondary | ICD-10-CM | POA: Diagnosis not present

## 2016-01-13 DIAGNOSIS — I10 Essential (primary) hypertension: Secondary | ICD-10-CM | POA: Diagnosis not present

## 2016-01-13 DIAGNOSIS — G459 Transient cerebral ischemic attack, unspecified: Secondary | ICD-10-CM | POA: Diagnosis not present

## 2016-01-13 DIAGNOSIS — K219 Gastro-esophageal reflux disease without esophagitis: Secondary | ICD-10-CM | POA: Diagnosis not present

## 2016-01-13 DIAGNOSIS — E039 Hypothyroidism, unspecified: Secondary | ICD-10-CM | POA: Diagnosis not present

## 2016-01-16 DIAGNOSIS — E039 Hypothyroidism, unspecified: Secondary | ICD-10-CM | POA: Diagnosis not present

## 2016-01-16 DIAGNOSIS — R739 Hyperglycemia, unspecified: Secondary | ICD-10-CM | POA: Diagnosis not present

## 2016-01-16 DIAGNOSIS — Z0000 Encounter for general adult medical examination without abnormal findings: Secondary | ICD-10-CM | POA: Diagnosis not present

## 2016-01-16 DIAGNOSIS — I7101 Dissection of thoracic aorta: Secondary | ICD-10-CM | POA: Diagnosis not present

## 2016-01-16 DIAGNOSIS — L718 Other rosacea: Secondary | ICD-10-CM | POA: Diagnosis not present

## 2016-01-16 DIAGNOSIS — Z6832 Body mass index (BMI) 32.0-32.9, adult: Secondary | ICD-10-CM | POA: Diagnosis not present

## 2016-01-16 DIAGNOSIS — K219 Gastro-esophageal reflux disease without esophagitis: Secondary | ICD-10-CM | POA: Diagnosis not present

## 2016-01-16 DIAGNOSIS — G459 Transient cerebral ischemic attack, unspecified: Secondary | ICD-10-CM | POA: Diagnosis not present

## 2016-01-28 DIAGNOSIS — G629 Polyneuropathy, unspecified: Secondary | ICD-10-CM | POA: Diagnosis not present

## 2016-04-21 DIAGNOSIS — G459 Transient cerebral ischemic attack, unspecified: Secondary | ICD-10-CM | POA: Diagnosis not present

## 2016-04-21 DIAGNOSIS — E039 Hypothyroidism, unspecified: Secondary | ICD-10-CM | POA: Diagnosis not present

## 2016-04-21 DIAGNOSIS — I10 Essential (primary) hypertension: Secondary | ICD-10-CM | POA: Diagnosis not present

## 2016-04-21 DIAGNOSIS — R739 Hyperglycemia, unspecified: Secondary | ICD-10-CM | POA: Diagnosis not present

## 2016-04-21 DIAGNOSIS — K219 Gastro-esophageal reflux disease without esophagitis: Secondary | ICD-10-CM | POA: Diagnosis not present

## 2016-04-21 DIAGNOSIS — M25561 Pain in right knee: Secondary | ICD-10-CM | POA: Diagnosis not present

## 2016-04-21 DIAGNOSIS — I7101 Dissection of thoracic aorta: Secondary | ICD-10-CM | POA: Diagnosis not present

## 2016-04-21 DIAGNOSIS — M170 Bilateral primary osteoarthritis of knee: Secondary | ICD-10-CM | POA: Diagnosis not present

## 2016-04-21 DIAGNOSIS — L718 Other rosacea: Secondary | ICD-10-CM | POA: Diagnosis not present

## 2016-04-30 ENCOUNTER — Encounter: Payer: Self-pay | Admitting: Internal Medicine

## 2016-05-12 DIAGNOSIS — Z1231 Encounter for screening mammogram for malignant neoplasm of breast: Secondary | ICD-10-CM | POA: Diagnosis not present

## 2016-06-15 DIAGNOSIS — H35033 Hypertensive retinopathy, bilateral: Secondary | ICD-10-CM | POA: Diagnosis not present

## 2016-06-19 DIAGNOSIS — M1711 Unilateral primary osteoarthritis, right knee: Secondary | ICD-10-CM | POA: Diagnosis not present

## 2016-07-03 ENCOUNTER — Ambulatory Visit (INDEPENDENT_AMBULATORY_CARE_PROVIDER_SITE_OTHER): Payer: Medicare Other | Admitting: Internal Medicine

## 2016-07-03 ENCOUNTER — Encounter: Payer: Self-pay | Admitting: Internal Medicine

## 2016-07-03 VITALS — BP 157/93 | HR 89 | Temp 97.6°F | Ht 65.0 in | Wt 192.0 lb

## 2016-07-03 DIAGNOSIS — K219 Gastro-esophageal reflux disease without esophagitis: Secondary | ICD-10-CM

## 2016-07-03 NOTE — Progress Notes (Signed)
Primary Care Physician:  Alexandria Bal, PA-C Primary Gastroenterologist:  Dr. Gala Jordan  Pre-Procedure History & Physical: HPI:  Alexandria Jordan is a 83 y.o. female here for her follow-up GERD and gas bloat. Gas bloat symptoms have resolved with probiotic therapy.  History small colonic adenoma removed 5 years ago. Negative EGD for "melena" Hemoccult negative and normal hemoglobin within the past couple of years. Has breakthrough reflux symptoms  -  only about 3 times monthly. Most the time exacerbations associated with eating late at night;   Takes Protonix 40 mg each morning. No dysphagia.  Past Medical History:  Diagnosis Date  . Adenoma 08/19/2006   tubular adenoma  . Diverticulosis   . GERD (gastroesophageal reflux disease)   . Hemorrhoids   . Hiatal hernia 05/08/2015  . Hx of adenomatous colonic polyps   . Hypothyroidism   . Schatzki's ring 05/08/2015  . Stroke ([place])   . TIA (transient ischemic attack)     Past Surgical History:  Procedure Laterality Date  . ABDOMINAL HYSTERECTOMY    . APPENDECTOMY    . bladder tack  2001  . CESAREAN SECTION    . COLONOSCOPY  07/2006   tubular adenoma, , left-sided diverticula  . COLONOSCOPY  06/17/2011   Dr. Gala Jordan- colonic diverticulosis, colonic polyps-tubular adenoma  . ESOPHAGOGASTRODUODENOSCOPY  01/2002   schatzki ring (not manipulated), small to moderate hh  . ESOPHAGOGASTRODUODENOSCOPY N/A 05/08/2015   Dr.Douglass Jordan- mild schatzki ring, medium sized hiatal hernia, normal second portion of the duodenum, no specimens collected.   . hysterectomy for endometriosis and fibroids  1978    Prior to Admission medications   Medication Sig Start Date End Date Taking? Authorizing Provider  albuterol (PROVENTIL HFA;VENTOLIN HFA) 108 (90 BASE) MCG/ACT inhaler Inhale 2 puffs into the lungs every 6 (six) hours as needed. For shortness of breath   Yes [provider]  Azelaic Acid (FINACEA) 15 % cream Apply 1 application topically as needed.  After skin is thoroughly washed and patted dry, gently but thoroughly massage a thin film of azelaic acid cream into the affected area twice daily, in the morning and evening.    Yes [provider]  Calcium Carbonate-Vit D-Min (CALTRATE 600+D PLUS) 600-400 MG-UNIT per tablet Take 1 tablet by mouth every morning.   Yes [provider]  cholecalciferol (VITAMIN D) 1000 UNITS tablet Take 1,000 Units by mouth daily.   Yes [provider]  clopidogrel (PLAVIX) 75 MG tablet Take 1 tablet by mouth daily. 05/18/12  Yes [provider]  fluticasone (FLONASE) 50 MCG/ACT nasal spray Place 2 sprays into the nose at bedtime.   Yes [provider]  gabapentin (NEURONTIN) 100 MG capsule Take 300 mg by mouth at bedtime.    Yes [provider]  losartan (COZAAR) 25 MG tablet Take by mouth daily.   Yes [provider]  pantoprazole (PROTONIX) 40 MG tablet Take 1 tablet by mouth daily. 06/13/12  Yes [provider]  SYNTHROID 125 MCG tablet Take 125 mcg by mouth daily before breakfast.  05/12/12  Yes [provider]  Thiamine HCl (VITAMIN B-1) 100 MG tablet Take 1,000 mg by mouth every morning.    Yes [provider]  vitamin B-12 (CYANOCOBALAMIN) 1000 MCG tablet Take 1,000 mcg by mouth every morning.    Yes [provider]  clobetasol cream (TEMOVATE) 0.05 %  10/04/13   [provider]  DETROL LA 4 MG 24 hr capsule Take 4 mg by mouth every  morning.  05/13/11   [provider]  Digestive Enzymes (DIGESTIVE SUPPORT PO) Take by mouth daily.    [provider]  folic acid (FOLVITE) 1 MG tablet Take 800 mg by mouth every morning.     [provider]  metroNIDAZOLE (METROCREAM) 0.75 % cream  10/04/13   [provider]  oxybutynin (DITROPAN-XL) 10 MG 24 hr tablet Take 10 mg by mouth daily.  08/10/12   [provider]  Sulfacetamide Sodium-Sulfur 10-2 % CREA Apply 1 application  topically every morning.    [provider]  triamcinolone cream (KENALOG) 0.1 % Apply 1 application topically.  07/07/12   [provider]    Allergies as of 07/03/2016 - Review Complete 07/03/2016  Allergen Reaction Noted  . Adhesive [tape] Rash 06/15/2011  . Fulvicin u-f [griseofulvin] Rash 05/22/2010    Family History  Problem Relation Age of Onset  . Diabetes Mother   . Other Mother        varicose veins    Social History   Social History  . Marital status: Widowed    Spouse name: N/A  . Number of children: N/A  . Years of education: N/A   Occupational History  . Not on file.   Social History Main Topics  . Smoking status: Never Smoker  . Smokeless tobacco: Never Used  . Alcohol use No  . Drug use: No  . Sexual activity: Not on file   Other Topics Concern  . Not on file   Social History Narrative  . No narrative on file    Review of Systems: See HPI, otherwise negative ROS  Physical Exam: BP (!) 157/93   Pulse 89   Temp 97.6 F (36.4 C) (Oral)   Ht 5\' 5"  (1.651 m)   Wt 192 lb (87.1 kg)   BMI 31.95 kg/m  General:   Alert,  , pleasant and cooperative in NAD  Impression:  Pleasant 83 year old lady with GERD  -  fairly well controlled with Protonix; she has some nocturnal breakthrough symptoms. No alarm features. History small colonic adenoma removed at age 10 (5 years ago).  Recommendations: With discussed the utility of doing one more colonoscopy. On balance, we decided not to pursue another colonoscopy unless new symptoms develop in the future. GERD fairly well controlled on Protonix 40 mg daily. Breakthrough symptoms largely related to dietary indiscretion.  Continue Protonix 40 mg daily; take before lunch   GERD information provided  May use Pepsid Complete as needed for flares of reflux  As discussed, no need for another EGD or colonoscopy unless new symptoms develop  Office visit here in 6 months and as  needed     Notice: This dictation was prepared with Dragon dictation along with smaller phrase technology. Any transcriptional errors that result from this process are unintentional and may not be corrected upon review.

## 2016-07-03 NOTE — Patient Instructions (Signed)
Continue Protonix 40 mg daily; take before lunch   GERD information provided  May use Pepsid Complete as needed for flares of reflux  As discussed, no need for another EGD or colonoscopy unless new symptoms develop  Office visit here in 6 months and as needed

## 2016-07-08 DIAGNOSIS — H2512 Age-related nuclear cataract, left eye: Secondary | ICD-10-CM | POA: Diagnosis not present

## 2016-07-08 DIAGNOSIS — E113293 Type 2 diabetes mellitus with mild nonproliferative diabetic retinopathy without macular edema, bilateral: Secondary | ICD-10-CM | POA: Diagnosis not present

## 2016-07-08 DIAGNOSIS — H35371 Puckering of macula, right eye: Secondary | ICD-10-CM | POA: Diagnosis not present

## 2016-07-08 DIAGNOSIS — H35033 Hypertensive retinopathy, bilateral: Secondary | ICD-10-CM | POA: Diagnosis not present

## 2016-07-08 DIAGNOSIS — H25013 Cortical age-related cataract, bilateral: Secondary | ICD-10-CM | POA: Diagnosis not present

## 2016-07-08 DIAGNOSIS — H2513 Age-related nuclear cataract, bilateral: Secondary | ICD-10-CM | POA: Diagnosis not present

## 2016-07-28 DIAGNOSIS — H2512 Age-related nuclear cataract, left eye: Secondary | ICD-10-CM | POA: Diagnosis not present

## 2016-07-28 DIAGNOSIS — H25812 Combined forms of age-related cataract, left eye: Secondary | ICD-10-CM | POA: Diagnosis not present

## 2016-08-10 DIAGNOSIS — H2511 Age-related nuclear cataract, right eye: Secondary | ICD-10-CM | POA: Diagnosis not present

## 2016-08-10 DIAGNOSIS — H25011 Cortical age-related cataract, right eye: Secondary | ICD-10-CM | POA: Diagnosis not present

## 2016-08-13 DIAGNOSIS — R739 Hyperglycemia, unspecified: Secondary | ICD-10-CM | POA: Diagnosis not present

## 2016-08-13 DIAGNOSIS — G459 Transient cerebral ischemic attack, unspecified: Secondary | ICD-10-CM | POA: Diagnosis not present

## 2016-08-13 DIAGNOSIS — E039 Hypothyroidism, unspecified: Secondary | ICD-10-CM | POA: Diagnosis not present

## 2016-08-13 DIAGNOSIS — K219 Gastro-esophageal reflux disease without esophagitis: Secondary | ICD-10-CM | POA: Diagnosis not present

## 2016-08-13 DIAGNOSIS — I10 Essential (primary) hypertension: Secondary | ICD-10-CM | POA: Diagnosis not present

## 2016-08-18 DIAGNOSIS — H2511 Age-related nuclear cataract, right eye: Secondary | ICD-10-CM | POA: Diagnosis not present

## 2016-08-18 DIAGNOSIS — H25811 Combined forms of age-related cataract, right eye: Secondary | ICD-10-CM | POA: Diagnosis not present

## 2016-08-20 DIAGNOSIS — I7101 Dissection of thoracic aorta: Secondary | ICD-10-CM | POA: Diagnosis not present

## 2016-08-20 DIAGNOSIS — M25561 Pain in right knee: Secondary | ICD-10-CM | POA: Diagnosis not present

## 2016-08-20 DIAGNOSIS — E039 Hypothyroidism, unspecified: Secondary | ICD-10-CM | POA: Diagnosis not present

## 2016-08-20 DIAGNOSIS — K219 Gastro-esophageal reflux disease without esophagitis: Secondary | ICD-10-CM | POA: Diagnosis not present

## 2016-08-20 DIAGNOSIS — I10 Essential (primary) hypertension: Secondary | ICD-10-CM | POA: Diagnosis not present

## 2016-08-20 DIAGNOSIS — R739 Hyperglycemia, unspecified: Secondary | ICD-10-CM | POA: Diagnosis not present

## 2016-08-20 DIAGNOSIS — Z6832 Body mass index (BMI) 32.0-32.9, adult: Secondary | ICD-10-CM | POA: Diagnosis not present

## 2016-08-20 DIAGNOSIS — G459 Transient cerebral ischemic attack, unspecified: Secondary | ICD-10-CM | POA: Diagnosis not present

## 2016-09-30 DIAGNOSIS — L820 Inflamed seborrheic keratosis: Secondary | ICD-10-CM | POA: Diagnosis not present

## 2016-09-30 DIAGNOSIS — L821 Other seborrheic keratosis: Secondary | ICD-10-CM | POA: Diagnosis not present

## 2016-09-30 DIAGNOSIS — L219 Seborrheic dermatitis, unspecified: Secondary | ICD-10-CM | POA: Diagnosis not present

## 2016-09-30 DIAGNOSIS — Z8589 Personal history of malignant neoplasm of other organs and systems: Secondary | ICD-10-CM | POA: Diagnosis not present

## 2016-10-10 DIAGNOSIS — Z8673 Personal history of transient ischemic attack (TIA), and cerebral infarction without residual deficits: Secondary | ICD-10-CM | POA: Diagnosis not present

## 2016-10-10 DIAGNOSIS — I638 Other cerebral infarction: Secondary | ICD-10-CM | POA: Diagnosis not present

## 2016-10-10 DIAGNOSIS — G459 Transient cerebral ischemic attack, unspecified: Secondary | ICD-10-CM | POA: Diagnosis not present

## 2016-10-12 DIAGNOSIS — E039 Hypothyroidism, unspecified: Secondary | ICD-10-CM | POA: Diagnosis not present

## 2016-10-12 DIAGNOSIS — K219 Gastro-esophageal reflux disease without esophagitis: Secondary | ICD-10-CM | POA: Diagnosis not present

## 2016-10-12 DIAGNOSIS — R2681 Unsteadiness on feet: Secondary | ICD-10-CM | POA: Diagnosis not present

## 2016-10-12 DIAGNOSIS — Z6832 Body mass index (BMI) 32.0-32.9, adult: Secondary | ICD-10-CM | POA: Diagnosis not present

## 2016-10-12 DIAGNOSIS — I10 Essential (primary) hypertension: Secondary | ICD-10-CM | POA: Diagnosis not present

## 2016-10-12 DIAGNOSIS — R296 Repeated falls: Secondary | ICD-10-CM | POA: Diagnosis not present

## 2016-10-12 DIAGNOSIS — G459 Transient cerebral ischemic attack, unspecified: Secondary | ICD-10-CM | POA: Diagnosis not present

## 2016-11-07 DIAGNOSIS — Z23 Encounter for immunization: Secondary | ICD-10-CM | POA: Diagnosis not present

## 2016-11-27 DIAGNOSIS — G459 Transient cerebral ischemic attack, unspecified: Secondary | ICD-10-CM | POA: Diagnosis not present

## 2016-12-07 DIAGNOSIS — R0989 Other specified symptoms and signs involving the circulatory and respiratory systems: Secondary | ICD-10-CM | POA: Diagnosis not present

## 2016-12-08 DIAGNOSIS — J069 Acute upper respiratory infection, unspecified: Secondary | ICD-10-CM | POA: Diagnosis not present

## 2016-12-08 DIAGNOSIS — R05 Cough: Secondary | ICD-10-CM | POA: Diagnosis not present

## 2016-12-08 DIAGNOSIS — Z6832 Body mass index (BMI) 32.0-32.9, adult: Secondary | ICD-10-CM | POA: Diagnosis not present

## 2016-12-10 ENCOUNTER — Encounter: Payer: Self-pay | Admitting: Internal Medicine

## 2016-12-14 DIAGNOSIS — Z6832 Body mass index (BMI) 32.0-32.9, adult: Secondary | ICD-10-CM | POA: Diagnosis not present

## 2016-12-14 DIAGNOSIS — J209 Acute bronchitis, unspecified: Secondary | ICD-10-CM | POA: Diagnosis not present

## 2016-12-14 DIAGNOSIS — R05 Cough: Secondary | ICD-10-CM | POA: Diagnosis not present

## 2016-12-21 DIAGNOSIS — M25561 Pain in right knee: Secondary | ICD-10-CM | POA: Diagnosis not present

## 2016-12-21 DIAGNOSIS — L719 Rosacea, unspecified: Secondary | ICD-10-CM | POA: Diagnosis not present

## 2016-12-21 DIAGNOSIS — K219 Gastro-esophageal reflux disease without esophagitis: Secondary | ICD-10-CM | POA: Diagnosis not present

## 2016-12-21 DIAGNOSIS — R739 Hyperglycemia, unspecified: Secondary | ICD-10-CM | POA: Diagnosis not present

## 2016-12-21 DIAGNOSIS — G459 Transient cerebral ischemic attack, unspecified: Secondary | ICD-10-CM | POA: Diagnosis not present

## 2016-12-21 DIAGNOSIS — I10 Essential (primary) hypertension: Secondary | ICD-10-CM | POA: Diagnosis not present

## 2016-12-21 DIAGNOSIS — R2681 Unsteadiness on feet: Secondary | ICD-10-CM | POA: Diagnosis not present

## 2016-12-21 DIAGNOSIS — E039 Hypothyroidism, unspecified: Secondary | ICD-10-CM | POA: Diagnosis not present

## 2016-12-21 DIAGNOSIS — Z6832 Body mass index (BMI) 32.0-32.9, adult: Secondary | ICD-10-CM | POA: Diagnosis not present

## 2017-01-29 DIAGNOSIS — Z8673 Personal history of transient ischemic attack (TIA), and cerebral infarction without residual deficits: Secondary | ICD-10-CM | POA: Diagnosis not present

## 2017-02-16 ENCOUNTER — Ambulatory Visit: Payer: Medicare Other | Admitting: Internal Medicine

## 2017-03-09 ENCOUNTER — Ambulatory Visit (INDEPENDENT_AMBULATORY_CARE_PROVIDER_SITE_OTHER): Payer: Medicare Other | Admitting: Internal Medicine

## 2017-03-09 ENCOUNTER — Encounter: Payer: Self-pay | Admitting: Internal Medicine

## 2017-03-09 VITALS — BP 147/91 | HR 85 | Temp 97.6°F | Ht 67.0 in | Wt 195.8 lb

## 2017-03-09 DIAGNOSIS — K219 Gastro-esophageal reflux disease without esophagitis: Secondary | ICD-10-CM

## 2017-03-09 NOTE — Patient Instructions (Signed)
GERD information provided   Continue Protonix 40 mg daily  Office in 6 months

## 2017-03-09 NOTE — Progress Notes (Addendum)
Primary Care Physician:  Lanelle Bal, PA-C Primary Gastroenterologist:  Dr. Gala Romney  Pre-Procedure History & Physical: HPI:  Alexandria Jordan is a 84 y.o. female here for follow-up of GERD. Takes Protonix 40 mg daily. Good control of reflux symptoms vast majority of the time. Has not needed to take supplemental H2/antiacid therapy. No dysphagia. History of small adenoma removedat age79-no further surveillance plan.  GERD symptoms well controlled. Gallbladder and appendix are out.  Past Medical History:  Diagnosis Date  . Adenoma 08/19/2006   tubular adenoma  . Diverticulosis   . GERD (gastroesophageal reflux disease)   . Hemorrhoids   . Hiatal hernia 05/08/2015  . Hx of adenomatous colonic polyps   . Hypothyroidism   . Schatzki's ring 05/08/2015  . Stroke ([place])   . TIA (transient ischemic attack)     Past Surgical History:  Procedure Laterality Date  . ABDOMINAL HYSTERECTOMY    . APPENDECTOMY    . bladder tack  2001  . CESAREAN SECTION    . COLONOSCOPY  07/2006   tubular adenoma, , left-sided diverticula  . COLONOSCOPY  06/17/2011   Dr. Gala Romney- colonic diverticulosis, colonic polyps-tubular adenoma  . ESOPHAGOGASTRODUODENOSCOPY  01/2002   schatzki ring (not manipulated), small to moderate hh  . ESOPHAGOGASTRODUODENOSCOPY N/A 05/08/2015   Dr.Buell Parcel- mild schatzki ring, medium sized hiatal hernia, normal second portion of the duodenum, no specimens collected.   . hysterectomy for endometriosis and fibroids  1978    Prior to Admission medications   Medication Sig Start Date End Date Taking? Authorizing Provider  albuterol (PROVENTIL HFA;VENTOLIN HFA) 108 (90 BASE) MCG/ACT inhaler Inhale 2 puffs into the lungs every 6 (six) hours as needed. For shortness of breath   Yes [provider]  Azelaic Acid (FINACEA) 15 % cream Apply 1 application topically as needed. After skin is thoroughly washed and patted dry, gently but thoroughly massage a thin film of azelaic acid cream  into the affected area twice daily, in the morning and evening.    Yes [provider]  Calcium Carbonate-Vit D-Min (CALTRATE 600+D PLUS) 600-400 MG-UNIT per tablet Take 1 tablet by mouth every morning.   Yes [provider]  cholecalciferol (VITAMIN D) 1000 UNITS tablet Take 1,000 Units by mouth daily.   Yes [provider]  clobetasol cream (TEMOVATE) 0.05 % as needed.  10/04/13  Yes [provider]  clopidogrel (PLAVIX) 75 MG tablet Take 1 tablet by mouth daily. 05/18/12  Yes [provider]  DETROL LA 4 MG 24 hr capsule Take 4 mg by mouth every morning.  05/13/11  Yes [provider]  Digestive Enzymes (DIGESTIVE SUPPORT PO) Take by mouth daily.   Yes [provider]  fluticasone (FLONASE) 50 MCG/ACT nasal spray Place 2 sprays into the nose as needed.    Yes [provider]  folic acid (FOLVITE) 1 MG tablet Take 800 mg by mouth every morning.    Yes [provider]  gabapentin (NEURONTIN) 100 MG capsule Take 300 mg by mouth at bedtime.    Yes [provider]  losartan (COZAAR) 25 MG tablet Take by mouth daily.   Yes [provider]  metroNIDAZOLE (METROCREAM) 0.75 % cream as needed.  10/04/13  Yes [provider]  oxybutynin (DITROPAN-XL) 10 MG 24 hr tablet Take 10 mg by mouth daily.  08/10/12  Yes [provider]  pantoprazole (PROTONIX) 40 MG tablet Take 1 tablet by mouth daily. 06/13/12  Yes [provider]  Sulfacetamide  Sodium-Sulfur 10-2 % CREA Apply 1 application topically every morning.   Yes [provider]  SYNTHROID 125 MCG tablet Take 125 mcg by mouth daily before breakfast.  05/12/12  Yes [provider]  Thiamine HCl (VITAMIN B-1) 100 MG tablet Take 1,000 mg by mouth every morning.    Yes [provider]  triamcinolone cream (KENALOG) 0.1 % Apply 1 application topically.  07/07/12  Yes [provider]  vitamin B-12 (CYANOCOBALAMIN)  1000 MCG tablet Take 1,000 mcg by mouth every morning.    Yes [provider]    Allergies as of 03/09/2017 - Review Complete 03/09/2017  Allergen Reaction Noted  . Adhesive [tape] Rash 06/15/2011  . Fulvicin u-f [griseofulvin] Rash 05/22/2010    Family History  Problem Relation Age of Onset  . Diabetes Mother   . Other Mother        varicose veins    Social History   Socioeconomic History  . Marital status: Widowed    Spouse name: Not on file  . Number of children: Not on file  . Years of education: Not on file  . Highest education level: Not on file  Social Needs  . Financial resource strain: Not on file  . Food insecurity - worry: Not on file  . Food insecurity - inability: Not on file  . Transportation needs - medical: Not on file  . Transportation needs - non-medical: Not on file  Occupational History  . Not on file  Tobacco Use  . Smoking status: Never Smoker  . Smokeless tobacco: Never Used  Substance and Sexual Activity  . Alcohol use: No  . Drug use: No  . Sexual activity: Not on file  Other Topics Concern  . Not on file  Social History Narrative  . Not on file    Review of Systems: See HPI, otherwise negative ROS  Physical Exam: BP (!) 147/91   Pulse 85   Temp 97.6 F (36.4 C) (Oral)   Ht 5\' 7"  (1.702 m)   Wt 195 lb 12.8 oz (88.8 kg)   BMI 30.67 kg/m  General:  Frail elderly lady. Pleasant, oriented in no acute distress. Lungs:  Clear throughout to auscultation.   No wheezes, crackles, or rhonchi. No acute distress. Heart:  Regular rate and rhythm; no murmurs, clicks, rubs,  or gallops. Abdomen: Non-distended, normal bowel sounds.  Soft and nontender without appreciable mass or hepatosplenomegaly.  Pulses:  Normal pulses noted. Extremities:  Without clubbing or edema.  Impression:  Pleasant 84 year old lady with a long-standing GERD  - well-controlled on Protonix 40 mg daily. No alarm symptoms this time. She is clinically doing very  well for her age. History of small colonic adenoma removed previously-no further colonoscopies recommended largely due to age.  Recommendations: Continue Protonix 40 mg daily indefinitely as I feel the benefits outweigh the risks. Information on the management of reflux provided to the patient's today.   Offices 6 months and as needed.           Notice: This dictation was prepared with Dragon dictation along with smaller phrase technology. Any transcriptional errors that result from this process are unintentional and may not be corrected upon review.

## 2017-03-29 DIAGNOSIS — N390 Urinary tract infection, site not specified: Secondary | ICD-10-CM | POA: Diagnosis not present

## 2017-03-29 DIAGNOSIS — R300 Dysuria: Secondary | ICD-10-CM | POA: Diagnosis not present

## 2017-03-29 DIAGNOSIS — R350 Frequency of micturition: Secondary | ICD-10-CM | POA: Diagnosis not present

## 2017-04-08 DIAGNOSIS — Z91048 Other nonmedicinal substance allergy status: Secondary | ICD-10-CM | POA: Diagnosis not present

## 2017-04-08 DIAGNOSIS — Z7982 Long term (current) use of aspirin: Secondary | ICD-10-CM | POA: Diagnosis not present

## 2017-04-08 DIAGNOSIS — Z7902 Long term (current) use of antithrombotics/antiplatelets: Secondary | ICD-10-CM | POA: Diagnosis not present

## 2017-04-08 DIAGNOSIS — I340 Nonrheumatic mitral (valve) insufficiency: Secondary | ICD-10-CM | POA: Diagnosis not present

## 2017-04-08 DIAGNOSIS — K219 Gastro-esophageal reflux disease without esophagitis: Secondary | ICD-10-CM | POA: Diagnosis not present

## 2017-04-08 DIAGNOSIS — Z8673 Personal history of transient ischemic attack (TIA), and cerebral infarction without residual deficits: Secondary | ICD-10-CM | POA: Diagnosis not present

## 2017-04-08 DIAGNOSIS — R0789 Other chest pain: Secondary | ICD-10-CM | POA: Diagnosis not present

## 2017-04-08 DIAGNOSIS — Z7984 Long term (current) use of oral hypoglycemic drugs: Secondary | ICD-10-CM | POA: Diagnosis not present

## 2017-04-08 DIAGNOSIS — I160 Hypertensive urgency: Secondary | ICD-10-CM | POA: Diagnosis not present

## 2017-04-08 DIAGNOSIS — Z9114 Patient's other noncompliance with medication regimen: Secondary | ICD-10-CM | POA: Diagnosis not present

## 2017-04-08 DIAGNOSIS — E039 Hypothyroidism, unspecified: Secondary | ICD-10-CM | POA: Diagnosis not present

## 2017-04-08 DIAGNOSIS — R0602 Shortness of breath: Secondary | ICD-10-CM | POA: Diagnosis not present

## 2017-04-08 DIAGNOSIS — I10 Essential (primary) hypertension: Secondary | ICD-10-CM | POA: Diagnosis not present

## 2017-04-08 DIAGNOSIS — E119 Type 2 diabetes mellitus without complications: Secondary | ICD-10-CM | POA: Diagnosis not present

## 2017-04-08 DIAGNOSIS — Z79899 Other long term (current) drug therapy: Secondary | ICD-10-CM | POA: Diagnosis not present

## 2017-04-08 DIAGNOSIS — R079 Chest pain, unspecified: Secondary | ICD-10-CM | POA: Diagnosis not present

## 2017-04-09 DIAGNOSIS — R079 Chest pain, unspecified: Secondary | ICD-10-CM | POA: Diagnosis not present

## 2017-04-09 DIAGNOSIS — R0789 Other chest pain: Secondary | ICD-10-CM | POA: Diagnosis not present

## 2017-04-09 DIAGNOSIS — I10 Essential (primary) hypertension: Secondary | ICD-10-CM | POA: Diagnosis not present

## 2017-04-09 DIAGNOSIS — R9431 Abnormal electrocardiogram [ECG] [EKG]: Secondary | ICD-10-CM | POA: Diagnosis not present

## 2017-04-09 DIAGNOSIS — I160 Hypertensive urgency: Secondary | ICD-10-CM | POA: Diagnosis not present

## 2017-05-13 DIAGNOSIS — R739 Hyperglycemia, unspecified: Secondary | ICD-10-CM | POA: Diagnosis not present

## 2017-05-13 DIAGNOSIS — I10 Essential (primary) hypertension: Secondary | ICD-10-CM | POA: Diagnosis not present

## 2017-05-13 DIAGNOSIS — K219 Gastro-esophageal reflux disease without esophagitis: Secondary | ICD-10-CM | POA: Diagnosis not present

## 2017-05-13 DIAGNOSIS — E039 Hypothyroidism, unspecified: Secondary | ICD-10-CM | POA: Diagnosis not present

## 2017-05-13 DIAGNOSIS — Z1231 Encounter for screening mammogram for malignant neoplasm of breast: Secondary | ICD-10-CM | POA: Diagnosis not present

## 2017-05-20 DIAGNOSIS — R2681 Unsteadiness on feet: Secondary | ICD-10-CM | POA: Diagnosis not present

## 2017-05-20 DIAGNOSIS — Z6832 Body mass index (BMI) 32.0-32.9, adult: Secondary | ICD-10-CM | POA: Diagnosis not present

## 2017-05-20 DIAGNOSIS — I10 Essential (primary) hypertension: Secondary | ICD-10-CM | POA: Diagnosis not present

## 2017-05-20 DIAGNOSIS — K219 Gastro-esophageal reflux disease without esophagitis: Secondary | ICD-10-CM | POA: Diagnosis not present

## 2017-05-20 DIAGNOSIS — E039 Hypothyroidism, unspecified: Secondary | ICD-10-CM | POA: Diagnosis not present

## 2017-05-20 DIAGNOSIS — L719 Rosacea, unspecified: Secondary | ICD-10-CM | POA: Diagnosis not present

## 2017-05-20 DIAGNOSIS — E0865 Diabetes mellitus due to underlying condition with hyperglycemia: Secondary | ICD-10-CM | POA: Diagnosis not present

## 2017-05-20 DIAGNOSIS — G459 Transient cerebral ischemic attack, unspecified: Secondary | ICD-10-CM | POA: Diagnosis not present

## 2017-08-02 ENCOUNTER — Encounter: Payer: Self-pay | Admitting: Internal Medicine

## 2017-08-27 ENCOUNTER — Other Ambulatory Visit: Payer: Self-pay

## 2017-09-16 DIAGNOSIS — K219 Gastro-esophageal reflux disease without esophagitis: Secondary | ICD-10-CM | POA: Diagnosis not present

## 2017-09-16 DIAGNOSIS — Z6832 Body mass index (BMI) 32.0-32.9, adult: Secondary | ICD-10-CM | POA: Diagnosis not present

## 2017-09-16 DIAGNOSIS — I7101 Dissection of thoracic aorta: Secondary | ICD-10-CM | POA: Diagnosis not present

## 2017-09-16 DIAGNOSIS — R2681 Unsteadiness on feet: Secondary | ICD-10-CM | POA: Diagnosis not present

## 2017-09-16 DIAGNOSIS — R739 Hyperglycemia, unspecified: Secondary | ICD-10-CM | POA: Diagnosis not present

## 2017-09-16 DIAGNOSIS — E0865 Diabetes mellitus due to underlying condition with hyperglycemia: Secondary | ICD-10-CM | POA: Diagnosis not present

## 2017-09-16 DIAGNOSIS — L719 Rosacea, unspecified: Secondary | ICD-10-CM | POA: Diagnosis not present

## 2017-09-16 DIAGNOSIS — I10 Essential (primary) hypertension: Secondary | ICD-10-CM | POA: Diagnosis not present

## 2017-09-16 DIAGNOSIS — E039 Hypothyroidism, unspecified: Secondary | ICD-10-CM | POA: Diagnosis not present

## 2017-09-16 DIAGNOSIS — G459 Transient cerebral ischemic attack, unspecified: Secondary | ICD-10-CM | POA: Diagnosis not present

## 2017-09-16 DIAGNOSIS — L718 Other rosacea: Secondary | ICD-10-CM | POA: Diagnosis not present

## 2017-10-18 DIAGNOSIS — L814 Other melanin hyperpigmentation: Secondary | ICD-10-CM | POA: Diagnosis not present

## 2017-10-18 DIAGNOSIS — I872 Venous insufficiency (chronic) (peripheral): Secondary | ICD-10-CM | POA: Diagnosis not present

## 2017-10-18 DIAGNOSIS — Z1283 Encounter for screening for malignant neoplasm of skin: Secondary | ICD-10-CM | POA: Diagnosis not present

## 2017-10-18 DIAGNOSIS — Z85828 Personal history of other malignant neoplasm of skin: Secondary | ICD-10-CM | POA: Diagnosis not present

## 2017-10-18 DIAGNOSIS — L570 Actinic keratosis: Secondary | ICD-10-CM | POA: Diagnosis not present

## 2017-10-18 DIAGNOSIS — D2260 Melanocytic nevi of unspecified upper limb, including shoulder: Secondary | ICD-10-CM | POA: Diagnosis not present

## 2017-10-18 DIAGNOSIS — L821 Other seborrheic keratosis: Secondary | ICD-10-CM | POA: Diagnosis not present

## 2017-10-18 DIAGNOSIS — D1801 Hemangioma of skin and subcutaneous tissue: Secondary | ICD-10-CM | POA: Diagnosis not present

## 2017-10-18 DIAGNOSIS — L578 Other skin changes due to chronic exposure to nonionizing radiation: Secondary | ICD-10-CM | POA: Diagnosis not present

## 2017-10-18 DIAGNOSIS — D225 Melanocytic nevi of trunk: Secondary | ICD-10-CM | POA: Diagnosis not present

## 2017-10-18 DIAGNOSIS — D2270 Melanocytic nevi of unspecified lower limb, including hip: Secondary | ICD-10-CM | POA: Diagnosis not present

## 2017-11-11 ENCOUNTER — Ambulatory Visit: Payer: Medicare Other | Admitting: Gastroenterology

## 2017-12-10 ENCOUNTER — Other Ambulatory Visit: Payer: Self-pay

## 2017-12-14 ENCOUNTER — Encounter: Payer: Self-pay | Admitting: Internal Medicine

## 2017-12-14 ENCOUNTER — Ambulatory Visit (INDEPENDENT_AMBULATORY_CARE_PROVIDER_SITE_OTHER): Payer: Medicare Other | Admitting: Internal Medicine

## 2017-12-14 VITALS — BP 183/97 | HR 72 | Temp 97.0°F | Ht 66.0 in | Wt 198.4 lb

## 2017-12-14 DIAGNOSIS — K219 Gastro-esophageal reflux disease without esophagitis: Secondary | ICD-10-CM | POA: Diagnosis not present

## 2017-12-14 NOTE — Patient Instructions (Signed)
Continue Protonix (Pantoprazole ) 40 mg daily  GERD information provided  As discussed, no future colonoscopy recommended unless new symptoms develop.  Office visit in 6 months     Thank you for entrusting me with your care. I appreciate the opportunity to create valuable relationships with patients and family members. You may receive a questionnaire in the mail regarding your visit here today.  It would be appreciated if you would take the time to return it.  If you were not completely satisfied with your experience, I would love to discuss any concerns with you.   Bridgette Habermann, M.D. [facility]

## 2017-12-14 NOTE — Progress Notes (Signed)
Primary Care Physician:  Lanelle Bal, PA-C Primary Gastroenterologist:  Dr. Gala Romney  Pre-Procedure History & Physical: HPI:  Alexandria Jordan is a 84 y.o. female here for follow-up of GERD.  Doing well on pantoprazole.  No dysphagia.  No lower GI tract symptoms.  Past Medical History:  Diagnosis Date  . Adenoma 08/19/2006   tubular adenoma  . Diverticulosis   . GERD (gastroesophageal reflux disease)   . Hemorrhoids   . Hiatal hernia 05/08/2015  . Hx of adenomatous colonic polyps   . Hypothyroidism   . Schatzki's ring 05/08/2015  . Stroke ([place])   . TIA (transient ischemic attack)     Past Surgical History:  Procedure Laterality Date  . ABDOMINAL HYSTERECTOMY    . APPENDECTOMY    . bladder tack  2001  . CESAREAN SECTION    . COLONOSCOPY  07/2006   tubular adenoma, , left-sided diverticula  . COLONOSCOPY  06/17/2011   Dr. Gala Romney- colonic diverticulosis, colonic polyps-tubular adenoma  . ESOPHAGOGASTRODUODENOSCOPY  01/2002   schatzki ring (not manipulated), small to moderate hh  . ESOPHAGOGASTRODUODENOSCOPY N/A 05/08/2015   Dr.- mild schatzki ring, medium sized hiatal hernia, normal second portion of the duodenum, no specimens collected.   . hysterectomy for endometriosis and fibroids  1978    Prior to Admission medications   Medication Sig Start Date End Date Taking? Authorizing Provider  albuterol (PROVENTIL HFA;VENTOLIN HFA) 108 (90 BASE) MCG/ACT inhaler Inhale 2 puffs into the lungs every 6 (six) hours as needed. For shortness of breath   Yes [provider]  Calcium Carbonate-Vit D-Min (CALTRATE 600+D PLUS) 600-400 MG-UNIT per tablet Take 1 tablet by mouth every morning.   Yes [provider]  cholecalciferol (VITAMIN D) 1000 UNITS tablet Take 1,000 Units by mouth daily.   Yes [provider]  clobetasol cream (TEMOVATE) 0.05 % as needed.  10/04/13  Yes [provider]  clopidogrel (PLAVIX) 75 MG tablet Take 1 tablet by mouth daily.  05/18/12  Yes [provider]  fluticasone (FLONASE) 50 MCG/ACT nasal spray Place 2 sprays into the nose as needed.    Yes [provider]  folic acid (FOLVITE) 1 MG tablet Take 800 mg by mouth every morning.    Yes [provider]  gabapentin (NEURONTIN) 100 MG capsule Take 300 mg by mouth at bedtime.    Yes [provider]  losartan (COZAAR) 25 MG tablet Take by mouth daily.   Yes [provider]  pantoprazole (PROTONIX) 40 MG tablet Take 1 tablet by mouth daily. 06/13/12  Yes [provider]  SYNTHROID 125 MCG tablet Take 125 mcg by mouth daily before breakfast.  05/12/12  Yes [provider]  Thiamine HCl (VITAMIN B-1) 100 MG tablet Take 1,000 mg by mouth every morning.    Yes [provider]  vitamin B-12 (CYANOCOBALAMIN) 1000 MCG tablet Take 1,000 mcg by mouth every morning.    Yes [provider]  Azelaic Acid (FINACEA) 15 % cream Apply 1 application topically as needed. After skin is thoroughly washed and patted dry, gently but thoroughly massage a thin film of azelaic acid cream into the affected area twice daily, in the morning and evening.     [provider]  DETROL LA 4 MG 24 hr capsule Take 4 mg by mouth every morning.  05/13/11   [provider]  Digestive Enzymes (DIGESTIVE SUPPORT PO) Take by mouth daily.    [provider]  metroNIDAZOLE (METROCREAM) 0.75 %  cream as needed.  10/04/13   [provider]  oxybutynin (DITROPAN-XL) 10 MG 24 hr tablet Take 10 mg by mouth daily.  08/10/12   [provider]  Sulfacetamide Sodium-Sulfur 10-2 % CREA Apply 1 application topically every morning.    [provider]  triamcinolone cream (KENALOG) 0.1 % Apply 1 application topically.  07/07/12   [provider]    Allergies as of 12/14/2017 - Review Complete 12/14/2017  Allergen Reaction Noted  . Adhesive [tape] Rash 06/15/2011  . Fulvicin u-f [griseofulvin]  Rash 05/22/2010    Family History  Problem Relation Age of Onset  . Diabetes Mother   . Other Mother        varicose veins    Social History   Socioeconomic History  . Marital status: Widowed    Spouse name: Not on file  . Number of children: Not on file  . Years of education: Not on file  . Highest education level: Not on file  Occupational History  . Not on file  Social Needs  . Financial resource strain: Not on file  . Food insecurity:    Worry: Not on file    Inability: Not on file  . Transportation needs:    Medical: Not on file    Non-medical: Not on file  Tobacco Use  . Smoking status: Never Smoker  . Smokeless tobacco: Never Used  Substance and Sexual Activity  . Alcohol use: No  . Drug use: No  . Sexual activity: Not on file  Lifestyle  . Physical activity:    Days per week: Not on file    Minutes per session: Not on file  . Stress: Not on file  Relationships  . Social connections:    Talks on phone: Not on file    Gets together: Not on file    Attends religious service: Not on file    Active member of club or organization: Not on file    Attends meetings of clubs or organizations: Not on file    Relationship status: Not on file  . Intimate partner violence:    Fear of current or ex partner: Not on file    Emotionally abused: Not on file    Physically abused: Not on file    Forced sexual activity: Not on file  Other Topics Concern  . Not on file  Social History Narrative  . Not on file    Review of Systems: See HPI, otherwise negative ROS  Physical Exam: BP (!) 183/97   Pulse 72   Temp (!) 97 F (36.1 C) (Oral)   Ht 5\' 6"  (1.676 m)   Wt 198 lb 6.4 oz (90 kg)   BMI 32.02 kg/m  General:   Alert,  Well-developed, well-nourished, pleasant and cooperative in NAD Lungs:  Clear throughout to auscultation.   No wheezes, crackles, or rhonchi. No acute distress. Heart:  Regular rate and rhythm; no murmurs, clicks, rubs,  or gallops. Abdomen:  Non-distended, normal bowel sounds.  Soft and nontender without appreciable mass or hepatosplenomegaly.  Pulses:  Normal pulses noted. Extremities:  Without clubbing or edema.  Impression/Plan: Pleasant 84 year old lady with long-standing GERD  -  well-controlled on pantoprazole 40 mg daily.  Feel the benefits of continued therapy outweigh the risks.  History of small adenomas  - removed previously.  No lower GI tract symptoms.  No plans for follow-up colonoscopy.   Recommendations:  Continue Protonix (Pantoprazole ) 40 mg daily -benefits outweigh the risks.  GERD information provided  As discussed, no future colonoscopy recommended unless new symptoms develop.  Office visit in 6 months      Notice: This dictation was prepared with Dragon dictation along with smaller phrase technology. Any transcriptional errors that result from this process are unintentional and may not be corrected upon review.

## 2018-01-13 DIAGNOSIS — R739 Hyperglycemia, unspecified: Secondary | ICD-10-CM | POA: Diagnosis not present

## 2018-01-13 DIAGNOSIS — Z23 Encounter for immunization: Secondary | ICD-10-CM | POA: Diagnosis not present

## 2018-01-13 DIAGNOSIS — I7101 Dissection of thoracic aorta: Secondary | ICD-10-CM | POA: Diagnosis not present

## 2018-01-13 DIAGNOSIS — G459 Transient cerebral ischemic attack, unspecified: Secondary | ICD-10-CM | POA: Diagnosis not present

## 2018-01-13 DIAGNOSIS — H6122 Impacted cerumen, left ear: Secondary | ICD-10-CM | POA: Diagnosis not present

## 2018-01-13 DIAGNOSIS — L718 Other rosacea: Secondary | ICD-10-CM | POA: Diagnosis not present

## 2018-01-13 DIAGNOSIS — I10 Essential (primary) hypertension: Secondary | ICD-10-CM | POA: Diagnosis not present

## 2018-01-13 DIAGNOSIS — L719 Rosacea, unspecified: Secondary | ICD-10-CM | POA: Diagnosis not present

## 2018-01-13 DIAGNOSIS — K219 Gastro-esophageal reflux disease without esophagitis: Secondary | ICD-10-CM | POA: Diagnosis not present

## 2018-01-13 DIAGNOSIS — E039 Hypothyroidism, unspecified: Secondary | ICD-10-CM | POA: Diagnosis not present

## 2018-01-13 DIAGNOSIS — E0865 Diabetes mellitus due to underlying condition with hyperglycemia: Secondary | ICD-10-CM | POA: Diagnosis not present

## 2018-01-13 DIAGNOSIS — R2681 Unsteadiness on feet: Secondary | ICD-10-CM | POA: Diagnosis not present

## 2018-01-13 DIAGNOSIS — Z6832 Body mass index (BMI) 32.0-32.9, adult: Secondary | ICD-10-CM | POA: Diagnosis not present

## 2018-03-15 DIAGNOSIS — Z7401 Bed confinement status: Secondary | ICD-10-CM | POA: Diagnosis not present

## 2018-03-15 DIAGNOSIS — F0390 Unspecified dementia without behavioral disturbance: Secondary | ICD-10-CM | POA: Diagnosis present

## 2018-03-15 DIAGNOSIS — Z741 Need for assistance with personal care: Secondary | ICD-10-CM | POA: Diagnosis not present

## 2018-03-15 DIAGNOSIS — E785 Hyperlipidemia, unspecified: Secondary | ICD-10-CM | POA: Diagnosis not present

## 2018-03-15 DIAGNOSIS — Z7984 Long term (current) use of oral hypoglycemic drugs: Secondary | ICD-10-CM | POA: Diagnosis not present

## 2018-03-15 DIAGNOSIS — E1140 Type 2 diabetes mellitus with diabetic neuropathy, unspecified: Secondary | ICD-10-CM | POA: Diagnosis not present

## 2018-03-15 DIAGNOSIS — E039 Hypothyroidism, unspecified: Secondary | ICD-10-CM | POA: Diagnosis not present

## 2018-03-15 DIAGNOSIS — J9601 Acute respiratory failure with hypoxia: Secondary | ICD-10-CM | POA: Diagnosis not present

## 2018-03-15 DIAGNOSIS — K219 Gastro-esophageal reflux disease without esophagitis: Secondary | ICD-10-CM | POA: Diagnosis not present

## 2018-03-15 DIAGNOSIS — I110 Hypertensive heart disease with heart failure: Secondary | ICD-10-CM | POA: Diagnosis not present

## 2018-03-15 DIAGNOSIS — E1142 Type 2 diabetes mellitus with diabetic polyneuropathy: Secondary | ICD-10-CM | POA: Diagnosis present

## 2018-03-15 DIAGNOSIS — Z8673 Personal history of transient ischemic attack (TIA), and cerebral infarction without residual deficits: Secondary | ICD-10-CM | POA: Diagnosis not present

## 2018-03-15 DIAGNOSIS — Z91048 Other nonmedicinal substance allergy status: Secondary | ICD-10-CM | POA: Diagnosis not present

## 2018-03-15 DIAGNOSIS — R5383 Other fatigue: Secondary | ICD-10-CM | POA: Diagnosis not present

## 2018-03-15 DIAGNOSIS — R2681 Unsteadiness on feet: Secondary | ICD-10-CM | POA: Diagnosis not present

## 2018-03-15 DIAGNOSIS — R531 Weakness: Secondary | ICD-10-CM | POA: Diagnosis present

## 2018-03-15 DIAGNOSIS — Z7902 Long term (current) use of antithrombotics/antiplatelets: Secondary | ICD-10-CM | POA: Diagnosis not present

## 2018-03-15 DIAGNOSIS — R0989 Other specified symptoms and signs involving the circulatory and respiratory systems: Secondary | ICD-10-CM | POA: Diagnosis not present

## 2018-03-15 DIAGNOSIS — J209 Acute bronchitis, unspecified: Secondary | ICD-10-CM | POA: Diagnosis not present

## 2018-03-15 DIAGNOSIS — I5032 Chronic diastolic (congestive) heart failure: Secondary | ICD-10-CM | POA: Diagnosis not present

## 2018-03-15 DIAGNOSIS — J84114 Acute interstitial pneumonitis: Secondary | ICD-10-CM | POA: Diagnosis not present

## 2018-03-15 DIAGNOSIS — M6281 Muscle weakness (generalized): Secondary | ICD-10-CM | POA: Diagnosis not present

## 2018-03-15 DIAGNOSIS — J189 Pneumonia, unspecified organism: Secondary | ICD-10-CM | POA: Diagnosis not present

## 2018-03-18 DIAGNOSIS — I5032 Chronic diastolic (congestive) heart failure: Secondary | ICD-10-CM | POA: Diagnosis not present

## 2018-03-18 DIAGNOSIS — Z7401 Bed confinement status: Secondary | ICD-10-CM | POA: Diagnosis not present

## 2018-03-18 DIAGNOSIS — R2681 Unsteadiness on feet: Secondary | ICD-10-CM | POA: Diagnosis not present

## 2018-03-18 DIAGNOSIS — I10 Essential (primary) hypertension: Secondary | ICD-10-CM | POA: Diagnosis not present

## 2018-03-18 DIAGNOSIS — M6281 Muscle weakness (generalized): Secondary | ICD-10-CM | POA: Diagnosis not present

## 2018-03-18 DIAGNOSIS — J9601 Acute respiratory failure with hypoxia: Secondary | ICD-10-CM | POA: Diagnosis not present

## 2018-03-18 DIAGNOSIS — E1140 Type 2 diabetes mellitus with diabetic neuropathy, unspecified: Secondary | ICD-10-CM | POA: Diagnosis not present

## 2018-03-18 DIAGNOSIS — Z741 Need for assistance with personal care: Secondary | ICD-10-CM | POA: Diagnosis not present

## 2018-03-18 DIAGNOSIS — R5383 Other fatigue: Secondary | ICD-10-CM | POA: Diagnosis not present

## 2018-03-18 DIAGNOSIS — I110 Hypertensive heart disease with heart failure: Secondary | ICD-10-CM | POA: Diagnosis not present

## 2018-03-18 DIAGNOSIS — E1142 Type 2 diabetes mellitus with diabetic polyneuropathy: Secondary | ICD-10-CM | POA: Diagnosis not present

## 2018-03-18 DIAGNOSIS — E785 Hyperlipidemia, unspecified: Secondary | ICD-10-CM | POA: Diagnosis not present

## 2018-03-18 DIAGNOSIS — E039 Hypothyroidism, unspecified: Secondary | ICD-10-CM | POA: Diagnosis not present

## 2018-03-18 DIAGNOSIS — I509 Heart failure, unspecified: Secondary | ICD-10-CM | POA: Diagnosis not present

## 2018-03-18 DIAGNOSIS — K219 Gastro-esophageal reflux disease without esophagitis: Secondary | ICD-10-CM | POA: Diagnosis not present

## 2018-03-18 DIAGNOSIS — J209 Acute bronchitis, unspecified: Secondary | ICD-10-CM | POA: Diagnosis not present

## 2018-03-18 DIAGNOSIS — F0390 Unspecified dementia without behavioral disturbance: Secondary | ICD-10-CM | POA: Diagnosis not present

## 2018-03-18 DIAGNOSIS — Z8673 Personal history of transient ischemic attack (TIA), and cerebral infarction without residual deficits: Secondary | ICD-10-CM | POA: Diagnosis not present

## 2018-04-19 DIAGNOSIS — E1142 Type 2 diabetes mellitus with diabetic polyneuropathy: Secondary | ICD-10-CM | POA: Diagnosis not present

## 2018-04-19 DIAGNOSIS — F0390 Unspecified dementia without behavioral disturbance: Secondary | ICD-10-CM | POA: Diagnosis not present

## 2018-04-19 DIAGNOSIS — E039 Hypothyroidism, unspecified: Secondary | ICD-10-CM | POA: Diagnosis not present

## 2018-04-19 DIAGNOSIS — M6281 Muscle weakness (generalized): Secondary | ICD-10-CM | POA: Diagnosis not present

## 2018-04-19 DIAGNOSIS — I10 Essential (primary) hypertension: Secondary | ICD-10-CM | POA: Diagnosis not present

## 2018-04-19 DIAGNOSIS — E785 Hyperlipidemia, unspecified: Secondary | ICD-10-CM | POA: Diagnosis not present

## 2018-04-19 DIAGNOSIS — I509 Heart failure, unspecified: Secondary | ICD-10-CM | POA: Diagnosis not present

## 2018-04-22 DIAGNOSIS — G9009 Other idiopathic peripheral autonomic neuropathy: Secondary | ICD-10-CM | POA: Diagnosis not present

## 2018-04-22 DIAGNOSIS — J9620 Acute and chronic respiratory failure, unspecified whether with hypoxia or hypercapnia: Secondary | ICD-10-CM | POA: Diagnosis not present

## 2018-04-22 DIAGNOSIS — M6281 Muscle weakness (generalized): Secondary | ICD-10-CM | POA: Diagnosis not present

## 2018-04-22 DIAGNOSIS — K210 Gastro-esophageal reflux disease with esophagitis: Secondary | ICD-10-CM | POA: Diagnosis not present

## 2018-04-22 DIAGNOSIS — I10 Essential (primary) hypertension: Secondary | ICD-10-CM | POA: Diagnosis not present

## 2018-04-22 DIAGNOSIS — F0390 Unspecified dementia without behavioral disturbance: Secondary | ICD-10-CM | POA: Diagnosis not present

## 2018-04-22 DIAGNOSIS — I5030 Unspecified diastolic (congestive) heart failure: Secondary | ICD-10-CM | POA: Diagnosis not present

## 2018-04-26 DIAGNOSIS — I5030 Unspecified diastolic (congestive) heart failure: Secondary | ICD-10-CM | POA: Diagnosis not present

## 2018-04-26 DIAGNOSIS — M6281 Muscle weakness (generalized): Secondary | ICD-10-CM | POA: Diagnosis not present

## 2018-04-26 DIAGNOSIS — K210 Gastro-esophageal reflux disease with esophagitis: Secondary | ICD-10-CM | POA: Diagnosis not present

## 2018-04-26 DIAGNOSIS — J9620 Acute and chronic respiratory failure, unspecified whether with hypoxia or hypercapnia: Secondary | ICD-10-CM | POA: Diagnosis not present

## 2018-04-26 DIAGNOSIS — F0390 Unspecified dementia without behavioral disturbance: Secondary | ICD-10-CM | POA: Diagnosis not present

## 2018-04-26 DIAGNOSIS — I10 Essential (primary) hypertension: Secondary | ICD-10-CM | POA: Diagnosis not present

## 2018-04-27 DIAGNOSIS — J9620 Acute and chronic respiratory failure, unspecified whether with hypoxia or hypercapnia: Secondary | ICD-10-CM | POA: Diagnosis not present

## 2018-04-27 DIAGNOSIS — K210 Gastro-esophageal reflux disease with esophagitis: Secondary | ICD-10-CM | POA: Diagnosis not present

## 2018-04-27 DIAGNOSIS — I5030 Unspecified diastolic (congestive) heart failure: Secondary | ICD-10-CM | POA: Diagnosis not present

## 2018-04-27 DIAGNOSIS — F0390 Unspecified dementia without behavioral disturbance: Secondary | ICD-10-CM | POA: Diagnosis not present

## 2018-04-27 DIAGNOSIS — M6281 Muscle weakness (generalized): Secondary | ICD-10-CM | POA: Diagnosis not present

## 2018-04-27 DIAGNOSIS — I10 Essential (primary) hypertension: Secondary | ICD-10-CM | POA: Diagnosis not present

## 2018-04-28 DIAGNOSIS — I10 Essential (primary) hypertension: Secondary | ICD-10-CM | POA: Diagnosis not present

## 2018-04-28 DIAGNOSIS — K210 Gastro-esophageal reflux disease with esophagitis: Secondary | ICD-10-CM | POA: Diagnosis not present

## 2018-04-28 DIAGNOSIS — I5030 Unspecified diastolic (congestive) heart failure: Secondary | ICD-10-CM | POA: Diagnosis not present

## 2018-04-28 DIAGNOSIS — J9620 Acute and chronic respiratory failure, unspecified whether with hypoxia or hypercapnia: Secondary | ICD-10-CM | POA: Diagnosis not present

## 2018-04-28 DIAGNOSIS — M6281 Muscle weakness (generalized): Secondary | ICD-10-CM | POA: Diagnosis not present

## 2018-04-28 DIAGNOSIS — F0390 Unspecified dementia without behavioral disturbance: Secondary | ICD-10-CM | POA: Diagnosis not present

## 2018-04-29 DIAGNOSIS — I10 Essential (primary) hypertension: Secondary | ICD-10-CM | POA: Diagnosis not present

## 2018-04-29 DIAGNOSIS — K210 Gastro-esophageal reflux disease with esophagitis: Secondary | ICD-10-CM | POA: Diagnosis not present

## 2018-04-29 DIAGNOSIS — F0390 Unspecified dementia without behavioral disturbance: Secondary | ICD-10-CM | POA: Diagnosis not present

## 2018-04-29 DIAGNOSIS — J9620 Acute and chronic respiratory failure, unspecified whether with hypoxia or hypercapnia: Secondary | ICD-10-CM | POA: Diagnosis not present

## 2018-04-29 DIAGNOSIS — I5030 Unspecified diastolic (congestive) heart failure: Secondary | ICD-10-CM | POA: Diagnosis not present

## 2018-04-29 DIAGNOSIS — M6281 Muscle weakness (generalized): Secondary | ICD-10-CM | POA: Diagnosis not present

## 2018-05-02 DIAGNOSIS — K210 Gastro-esophageal reflux disease with esophagitis: Secondary | ICD-10-CM | POA: Diagnosis not present

## 2018-05-02 DIAGNOSIS — Z6833 Body mass index (BMI) 33.0-33.9, adult: Secondary | ICD-10-CM | POA: Diagnosis not present

## 2018-05-02 DIAGNOSIS — S0086XA Insect bite (nonvenomous) of other part of head, initial encounter: Secondary | ICD-10-CM | POA: Diagnosis not present

## 2018-05-02 DIAGNOSIS — F0390 Unspecified dementia without behavioral disturbance: Secondary | ICD-10-CM | POA: Diagnosis not present

## 2018-05-02 DIAGNOSIS — J9620 Acute and chronic respiratory failure, unspecified whether with hypoxia or hypercapnia: Secondary | ICD-10-CM | POA: Diagnosis not present

## 2018-05-02 DIAGNOSIS — I5030 Unspecified diastolic (congestive) heart failure: Secondary | ICD-10-CM | POA: Diagnosis not present

## 2018-05-02 DIAGNOSIS — I10 Essential (primary) hypertension: Secondary | ICD-10-CM | POA: Diagnosis not present

## 2018-05-02 DIAGNOSIS — M6281 Muscle weakness (generalized): Secondary | ICD-10-CM | POA: Diagnosis not present

## 2018-05-02 DIAGNOSIS — R600 Localized edema: Secondary | ICD-10-CM | POA: Diagnosis not present

## 2018-05-04 DIAGNOSIS — M6281 Muscle weakness (generalized): Secondary | ICD-10-CM | POA: Diagnosis not present

## 2018-05-04 DIAGNOSIS — K210 Gastro-esophageal reflux disease with esophagitis: Secondary | ICD-10-CM | POA: Diagnosis not present

## 2018-05-04 DIAGNOSIS — I5030 Unspecified diastolic (congestive) heart failure: Secondary | ICD-10-CM | POA: Diagnosis not present

## 2018-05-04 DIAGNOSIS — F0390 Unspecified dementia without behavioral disturbance: Secondary | ICD-10-CM | POA: Diagnosis not present

## 2018-05-04 DIAGNOSIS — J9620 Acute and chronic respiratory failure, unspecified whether with hypoxia or hypercapnia: Secondary | ICD-10-CM | POA: Diagnosis not present

## 2018-05-04 DIAGNOSIS — I10 Essential (primary) hypertension: Secondary | ICD-10-CM | POA: Diagnosis not present

## 2018-05-05 DIAGNOSIS — I5030 Unspecified diastolic (congestive) heart failure: Secondary | ICD-10-CM | POA: Diagnosis not present

## 2018-05-05 DIAGNOSIS — F0390 Unspecified dementia without behavioral disturbance: Secondary | ICD-10-CM | POA: Diagnosis not present

## 2018-05-05 DIAGNOSIS — I10 Essential (primary) hypertension: Secondary | ICD-10-CM | POA: Diagnosis not present

## 2018-05-05 DIAGNOSIS — K210 Gastro-esophageal reflux disease with esophagitis: Secondary | ICD-10-CM | POA: Diagnosis not present

## 2018-05-05 DIAGNOSIS — J9620 Acute and chronic respiratory failure, unspecified whether with hypoxia or hypercapnia: Secondary | ICD-10-CM | POA: Diagnosis not present

## 2018-05-05 DIAGNOSIS — M6281 Muscle weakness (generalized): Secondary | ICD-10-CM | POA: Diagnosis not present

## 2018-05-06 DIAGNOSIS — F0390 Unspecified dementia without behavioral disturbance: Secondary | ICD-10-CM | POA: Diagnosis not present

## 2018-05-06 DIAGNOSIS — M6281 Muscle weakness (generalized): Secondary | ICD-10-CM | POA: Diagnosis not present

## 2018-05-06 DIAGNOSIS — K210 Gastro-esophageal reflux disease with esophagitis: Secondary | ICD-10-CM | POA: Diagnosis not present

## 2018-05-06 DIAGNOSIS — J9620 Acute and chronic respiratory failure, unspecified whether with hypoxia or hypercapnia: Secondary | ICD-10-CM | POA: Diagnosis not present

## 2018-05-06 DIAGNOSIS — I10 Essential (primary) hypertension: Secondary | ICD-10-CM | POA: Diagnosis not present

## 2018-05-06 DIAGNOSIS — I5030 Unspecified diastolic (congestive) heart failure: Secondary | ICD-10-CM | POA: Diagnosis not present

## 2018-05-09 DIAGNOSIS — J9620 Acute and chronic respiratory failure, unspecified whether with hypoxia or hypercapnia: Secondary | ICD-10-CM | POA: Diagnosis not present

## 2018-05-09 DIAGNOSIS — M6281 Muscle weakness (generalized): Secondary | ICD-10-CM | POA: Diagnosis not present

## 2018-05-09 DIAGNOSIS — I10 Essential (primary) hypertension: Secondary | ICD-10-CM | POA: Diagnosis not present

## 2018-05-09 DIAGNOSIS — K210 Gastro-esophageal reflux disease with esophagitis: Secondary | ICD-10-CM | POA: Diagnosis not present

## 2018-05-09 DIAGNOSIS — I5030 Unspecified diastolic (congestive) heart failure: Secondary | ICD-10-CM | POA: Diagnosis not present

## 2018-05-09 DIAGNOSIS — F0390 Unspecified dementia without behavioral disturbance: Secondary | ICD-10-CM | POA: Diagnosis not present

## 2018-05-10 DIAGNOSIS — I10 Essential (primary) hypertension: Secondary | ICD-10-CM | POA: Diagnosis not present

## 2018-05-10 DIAGNOSIS — J9620 Acute and chronic respiratory failure, unspecified whether with hypoxia or hypercapnia: Secondary | ICD-10-CM | POA: Diagnosis not present

## 2018-05-10 DIAGNOSIS — F0390 Unspecified dementia without behavioral disturbance: Secondary | ICD-10-CM | POA: Diagnosis not present

## 2018-05-10 DIAGNOSIS — M6281 Muscle weakness (generalized): Secondary | ICD-10-CM | POA: Diagnosis not present

## 2018-05-10 DIAGNOSIS — I5030 Unspecified diastolic (congestive) heart failure: Secondary | ICD-10-CM | POA: Diagnosis not present

## 2018-05-10 DIAGNOSIS — K210 Gastro-esophageal reflux disease with esophagitis: Secondary | ICD-10-CM | POA: Diagnosis not present

## 2018-05-11 DIAGNOSIS — F0390 Unspecified dementia without behavioral disturbance: Secondary | ICD-10-CM | POA: Diagnosis not present

## 2018-05-11 DIAGNOSIS — I10 Essential (primary) hypertension: Secondary | ICD-10-CM | POA: Diagnosis not present

## 2018-05-11 DIAGNOSIS — I5030 Unspecified diastolic (congestive) heart failure: Secondary | ICD-10-CM | POA: Diagnosis not present

## 2018-05-11 DIAGNOSIS — K210 Gastro-esophageal reflux disease with esophagitis: Secondary | ICD-10-CM | POA: Diagnosis not present

## 2018-05-11 DIAGNOSIS — M6281 Muscle weakness (generalized): Secondary | ICD-10-CM | POA: Diagnosis not present

## 2018-05-11 DIAGNOSIS — J9620 Acute and chronic respiratory failure, unspecified whether with hypoxia or hypercapnia: Secondary | ICD-10-CM | POA: Diagnosis not present

## 2018-05-13 DIAGNOSIS — M6281 Muscle weakness (generalized): Secondary | ICD-10-CM | POA: Diagnosis not present

## 2018-05-13 DIAGNOSIS — I10 Essential (primary) hypertension: Secondary | ICD-10-CM | POA: Diagnosis not present

## 2018-05-13 DIAGNOSIS — J9620 Acute and chronic respiratory failure, unspecified whether with hypoxia or hypercapnia: Secondary | ICD-10-CM | POA: Diagnosis not present

## 2018-05-13 DIAGNOSIS — I5030 Unspecified diastolic (congestive) heart failure: Secondary | ICD-10-CM | POA: Diagnosis not present

## 2018-05-13 DIAGNOSIS — K210 Gastro-esophageal reflux disease with esophagitis: Secondary | ICD-10-CM | POA: Diagnosis not present

## 2018-05-13 DIAGNOSIS — F0390 Unspecified dementia without behavioral disturbance: Secondary | ICD-10-CM | POA: Diagnosis not present

## 2018-05-16 DIAGNOSIS — K210 Gastro-esophageal reflux disease with esophagitis: Secondary | ICD-10-CM | POA: Diagnosis not present

## 2018-05-16 DIAGNOSIS — J9620 Acute and chronic respiratory failure, unspecified whether with hypoxia or hypercapnia: Secondary | ICD-10-CM | POA: Diagnosis not present

## 2018-05-16 DIAGNOSIS — M6281 Muscle weakness (generalized): Secondary | ICD-10-CM | POA: Diagnosis not present

## 2018-05-16 DIAGNOSIS — F0390 Unspecified dementia without behavioral disturbance: Secondary | ICD-10-CM | POA: Diagnosis not present

## 2018-05-16 DIAGNOSIS — I5030 Unspecified diastolic (congestive) heart failure: Secondary | ICD-10-CM | POA: Diagnosis not present

## 2018-05-16 DIAGNOSIS — I10 Essential (primary) hypertension: Secondary | ICD-10-CM | POA: Diagnosis not present

## 2018-05-17 DIAGNOSIS — I5030 Unspecified diastolic (congestive) heart failure: Secondary | ICD-10-CM | POA: Diagnosis not present

## 2018-05-17 DIAGNOSIS — F0390 Unspecified dementia without behavioral disturbance: Secondary | ICD-10-CM | POA: Diagnosis not present

## 2018-05-17 DIAGNOSIS — M6281 Muscle weakness (generalized): Secondary | ICD-10-CM | POA: Diagnosis not present

## 2018-05-17 DIAGNOSIS — K210 Gastro-esophageal reflux disease with esophagitis: Secondary | ICD-10-CM | POA: Diagnosis not present

## 2018-05-17 DIAGNOSIS — I10 Essential (primary) hypertension: Secondary | ICD-10-CM | POA: Diagnosis not present

## 2018-05-17 DIAGNOSIS — J9620 Acute and chronic respiratory failure, unspecified whether with hypoxia or hypercapnia: Secondary | ICD-10-CM | POA: Diagnosis not present

## 2018-05-18 DIAGNOSIS — K210 Gastro-esophageal reflux disease with esophagitis: Secondary | ICD-10-CM | POA: Diagnosis not present

## 2018-05-18 DIAGNOSIS — F0390 Unspecified dementia without behavioral disturbance: Secondary | ICD-10-CM | POA: Diagnosis not present

## 2018-05-18 DIAGNOSIS — I10 Essential (primary) hypertension: Secondary | ICD-10-CM | POA: Diagnosis not present

## 2018-05-18 DIAGNOSIS — J9620 Acute and chronic respiratory failure, unspecified whether with hypoxia or hypercapnia: Secondary | ICD-10-CM | POA: Diagnosis not present

## 2018-05-18 DIAGNOSIS — M6281 Muscle weakness (generalized): Secondary | ICD-10-CM | POA: Diagnosis not present

## 2018-05-18 DIAGNOSIS — I5030 Unspecified diastolic (congestive) heart failure: Secondary | ICD-10-CM | POA: Diagnosis not present

## 2018-05-20 DIAGNOSIS — M6281 Muscle weakness (generalized): Secondary | ICD-10-CM | POA: Diagnosis not present

## 2018-05-20 DIAGNOSIS — J9620 Acute and chronic respiratory failure, unspecified whether with hypoxia or hypercapnia: Secondary | ICD-10-CM | POA: Diagnosis not present

## 2018-05-20 DIAGNOSIS — F0390 Unspecified dementia without behavioral disturbance: Secondary | ICD-10-CM | POA: Diagnosis not present

## 2018-05-20 DIAGNOSIS — I5030 Unspecified diastolic (congestive) heart failure: Secondary | ICD-10-CM | POA: Diagnosis not present

## 2018-05-20 DIAGNOSIS — K210 Gastro-esophageal reflux disease with esophagitis: Secondary | ICD-10-CM | POA: Diagnosis not present

## 2018-05-20 DIAGNOSIS — I10 Essential (primary) hypertension: Secondary | ICD-10-CM | POA: Diagnosis not present

## 2018-05-22 DIAGNOSIS — F0390 Unspecified dementia without behavioral disturbance: Secondary | ICD-10-CM | POA: Diagnosis not present

## 2018-05-22 DIAGNOSIS — M6281 Muscle weakness (generalized): Secondary | ICD-10-CM | POA: Diagnosis not present

## 2018-05-22 DIAGNOSIS — I5030 Unspecified diastolic (congestive) heart failure: Secondary | ICD-10-CM | POA: Diagnosis not present

## 2018-05-22 DIAGNOSIS — G9009 Other idiopathic peripheral autonomic neuropathy: Secondary | ICD-10-CM | POA: Diagnosis not present

## 2018-05-22 DIAGNOSIS — J9620 Acute and chronic respiratory failure, unspecified whether with hypoxia or hypercapnia: Secondary | ICD-10-CM | POA: Diagnosis not present

## 2018-05-22 DIAGNOSIS — K210 Gastro-esophageal reflux disease with esophagitis: Secondary | ICD-10-CM | POA: Diagnosis not present

## 2018-05-22 DIAGNOSIS — I10 Essential (primary) hypertension: Secondary | ICD-10-CM | POA: Diagnosis not present

## 2018-05-23 DIAGNOSIS — K210 Gastro-esophageal reflux disease with esophagitis: Secondary | ICD-10-CM | POA: Diagnosis not present

## 2018-05-23 DIAGNOSIS — M6281 Muscle weakness (generalized): Secondary | ICD-10-CM | POA: Diagnosis not present

## 2018-05-23 DIAGNOSIS — F0390 Unspecified dementia without behavioral disturbance: Secondary | ICD-10-CM | POA: Diagnosis not present

## 2018-05-23 DIAGNOSIS — I5030 Unspecified diastolic (congestive) heart failure: Secondary | ICD-10-CM | POA: Diagnosis not present

## 2018-05-23 DIAGNOSIS — I10 Essential (primary) hypertension: Secondary | ICD-10-CM | POA: Diagnosis not present

## 2018-05-23 DIAGNOSIS — J9620 Acute and chronic respiratory failure, unspecified whether with hypoxia or hypercapnia: Secondary | ICD-10-CM | POA: Diagnosis not present

## 2018-05-24 DIAGNOSIS — M6281 Muscle weakness (generalized): Secondary | ICD-10-CM | POA: Diagnosis not present

## 2018-05-24 DIAGNOSIS — J9620 Acute and chronic respiratory failure, unspecified whether with hypoxia or hypercapnia: Secondary | ICD-10-CM | POA: Diagnosis not present

## 2018-05-24 DIAGNOSIS — I5030 Unspecified diastolic (congestive) heart failure: Secondary | ICD-10-CM | POA: Diagnosis not present

## 2018-05-24 DIAGNOSIS — I10 Essential (primary) hypertension: Secondary | ICD-10-CM | POA: Diagnosis not present

## 2018-05-24 DIAGNOSIS — K210 Gastro-esophageal reflux disease with esophagitis: Secondary | ICD-10-CM | POA: Diagnosis not present

## 2018-05-24 DIAGNOSIS — F0390 Unspecified dementia without behavioral disturbance: Secondary | ICD-10-CM | POA: Diagnosis not present

## 2018-05-25 DIAGNOSIS — I5030 Unspecified diastolic (congestive) heart failure: Secondary | ICD-10-CM | POA: Diagnosis not present

## 2018-05-25 DIAGNOSIS — F0390 Unspecified dementia without behavioral disturbance: Secondary | ICD-10-CM | POA: Diagnosis not present

## 2018-05-25 DIAGNOSIS — K210 Gastro-esophageal reflux disease with esophagitis: Secondary | ICD-10-CM | POA: Diagnosis not present

## 2018-05-25 DIAGNOSIS — J9620 Acute and chronic respiratory failure, unspecified whether with hypoxia or hypercapnia: Secondary | ICD-10-CM | POA: Diagnosis not present

## 2018-05-25 DIAGNOSIS — M6281 Muscle weakness (generalized): Secondary | ICD-10-CM | POA: Diagnosis not present

## 2018-05-25 DIAGNOSIS — I10 Essential (primary) hypertension: Secondary | ICD-10-CM | POA: Diagnosis not present

## 2018-05-27 DIAGNOSIS — I10 Essential (primary) hypertension: Secondary | ICD-10-CM | POA: Diagnosis not present

## 2018-05-27 DIAGNOSIS — J9620 Acute and chronic respiratory failure, unspecified whether with hypoxia or hypercapnia: Secondary | ICD-10-CM | POA: Diagnosis not present

## 2018-05-27 DIAGNOSIS — F0390 Unspecified dementia without behavioral disturbance: Secondary | ICD-10-CM | POA: Diagnosis not present

## 2018-05-27 DIAGNOSIS — I5030 Unspecified diastolic (congestive) heart failure: Secondary | ICD-10-CM | POA: Diagnosis not present

## 2018-05-27 DIAGNOSIS — K210 Gastro-esophageal reflux disease with esophagitis: Secondary | ICD-10-CM | POA: Diagnosis not present

## 2018-05-27 DIAGNOSIS — M6281 Muscle weakness (generalized): Secondary | ICD-10-CM | POA: Diagnosis not present

## 2018-05-30 DIAGNOSIS — K210 Gastro-esophageal reflux disease with esophagitis: Secondary | ICD-10-CM | POA: Diagnosis not present

## 2018-05-30 DIAGNOSIS — F0390 Unspecified dementia without behavioral disturbance: Secondary | ICD-10-CM | POA: Diagnosis not present

## 2018-05-30 DIAGNOSIS — J9620 Acute and chronic respiratory failure, unspecified whether with hypoxia or hypercapnia: Secondary | ICD-10-CM | POA: Diagnosis not present

## 2018-05-30 DIAGNOSIS — I10 Essential (primary) hypertension: Secondary | ICD-10-CM | POA: Diagnosis not present

## 2018-05-30 DIAGNOSIS — M6281 Muscle weakness (generalized): Secondary | ICD-10-CM | POA: Diagnosis not present

## 2018-05-30 DIAGNOSIS — I5030 Unspecified diastolic (congestive) heart failure: Secondary | ICD-10-CM | POA: Diagnosis not present

## 2018-06-01 DIAGNOSIS — M6281 Muscle weakness (generalized): Secondary | ICD-10-CM | POA: Diagnosis not present

## 2018-06-01 DIAGNOSIS — I5030 Unspecified diastolic (congestive) heart failure: Secondary | ICD-10-CM | POA: Diagnosis not present

## 2018-06-01 DIAGNOSIS — I10 Essential (primary) hypertension: Secondary | ICD-10-CM | POA: Diagnosis not present

## 2018-06-01 DIAGNOSIS — K210 Gastro-esophageal reflux disease with esophagitis: Secondary | ICD-10-CM | POA: Diagnosis not present

## 2018-06-01 DIAGNOSIS — J9620 Acute and chronic respiratory failure, unspecified whether with hypoxia or hypercapnia: Secondary | ICD-10-CM | POA: Diagnosis not present

## 2018-06-01 DIAGNOSIS — F0390 Unspecified dementia without behavioral disturbance: Secondary | ICD-10-CM | POA: Diagnosis not present

## 2018-06-02 DIAGNOSIS — I10 Essential (primary) hypertension: Secondary | ICD-10-CM | POA: Diagnosis not present

## 2018-06-02 DIAGNOSIS — F0390 Unspecified dementia without behavioral disturbance: Secondary | ICD-10-CM | POA: Diagnosis not present

## 2018-06-02 DIAGNOSIS — M6281 Muscle weakness (generalized): Secondary | ICD-10-CM | POA: Diagnosis not present

## 2018-06-02 DIAGNOSIS — K210 Gastro-esophageal reflux disease with esophagitis: Secondary | ICD-10-CM | POA: Diagnosis not present

## 2018-06-02 DIAGNOSIS — I5030 Unspecified diastolic (congestive) heart failure: Secondary | ICD-10-CM | POA: Diagnosis not present

## 2018-06-02 DIAGNOSIS — J9620 Acute and chronic respiratory failure, unspecified whether with hypoxia or hypercapnia: Secondary | ICD-10-CM | POA: Diagnosis not present

## 2018-06-03 DIAGNOSIS — I5030 Unspecified diastolic (congestive) heart failure: Secondary | ICD-10-CM | POA: Diagnosis not present

## 2018-06-03 DIAGNOSIS — M6281 Muscle weakness (generalized): Secondary | ICD-10-CM | POA: Diagnosis not present

## 2018-06-03 DIAGNOSIS — J9620 Acute and chronic respiratory failure, unspecified whether with hypoxia or hypercapnia: Secondary | ICD-10-CM | POA: Diagnosis not present

## 2018-06-03 DIAGNOSIS — I10 Essential (primary) hypertension: Secondary | ICD-10-CM | POA: Diagnosis not present

## 2018-06-03 DIAGNOSIS — K210 Gastro-esophageal reflux disease with esophagitis: Secondary | ICD-10-CM | POA: Diagnosis not present

## 2018-06-03 DIAGNOSIS — F0390 Unspecified dementia without behavioral disturbance: Secondary | ICD-10-CM | POA: Diagnosis not present

## 2018-06-06 DIAGNOSIS — E1165 Type 2 diabetes mellitus with hyperglycemia: Secondary | ICD-10-CM | POA: Diagnosis not present

## 2018-06-06 DIAGNOSIS — I5032 Chronic diastolic (congestive) heart failure: Secondary | ICD-10-CM | POA: Diagnosis not present

## 2018-06-06 DIAGNOSIS — I160 Hypertensive urgency: Secondary | ICD-10-CM | POA: Diagnosis not present

## 2018-06-06 DIAGNOSIS — R531 Weakness: Secondary | ICD-10-CM | POA: Diagnosis not present

## 2018-06-06 DIAGNOSIS — I639 Cerebral infarction, unspecified: Secondary | ICD-10-CM | POA: Diagnosis not present

## 2018-06-06 DIAGNOSIS — R4182 Altered mental status, unspecified: Secondary | ICD-10-CM | POA: Diagnosis not present

## 2018-06-06 DIAGNOSIS — I674 Hypertensive encephalopathy: Secondary | ICD-10-CM | POA: Diagnosis not present

## 2018-06-06 DIAGNOSIS — R7989 Other specified abnormal findings of blood chemistry: Secondary | ICD-10-CM | POA: Diagnosis not present

## 2018-06-06 DIAGNOSIS — R4701 Aphasia: Secondary | ICD-10-CM | POA: Diagnosis not present

## 2018-06-06 DIAGNOSIS — R51 Headache: Secondary | ICD-10-CM | POA: Diagnosis not present

## 2018-06-06 DIAGNOSIS — Z0289 Encounter for other administrative examinations: Secondary | ICD-10-CM | POA: Diagnosis not present

## 2018-06-06 DIAGNOSIS — G459 Transient cerebral ischemic attack, unspecified: Secondary | ICD-10-CM | POA: Diagnosis not present

## 2018-06-06 DIAGNOSIS — R29818 Other symptoms and signs involving the nervous system: Secondary | ICD-10-CM | POA: Diagnosis not present

## 2018-06-06 DIAGNOSIS — L03114 Cellulitis of left upper limb: Secondary | ICD-10-CM | POA: Diagnosis not present

## 2018-06-06 DIAGNOSIS — R0902 Hypoxemia: Secondary | ICD-10-CM | POA: Diagnosis not present

## 2018-06-06 DIAGNOSIS — I10 Essential (primary) hypertension: Secondary | ICD-10-CM | POA: Diagnosis not present

## 2018-06-07 ENCOUNTER — Encounter: Payer: Self-pay | Admitting: Internal Medicine

## 2018-06-07 DIAGNOSIS — Z79899 Other long term (current) drug therapy: Secondary | ICD-10-CM | POA: Diagnosis not present

## 2018-06-07 DIAGNOSIS — Z7984 Long term (current) use of oral hypoglycemic drugs: Secondary | ICD-10-CM | POA: Diagnosis not present

## 2018-06-07 DIAGNOSIS — R4701 Aphasia: Secondary | ICD-10-CM | POA: Diagnosis present

## 2018-06-07 DIAGNOSIS — I5032 Chronic diastolic (congestive) heart failure: Secondary | ICD-10-CM | POA: Diagnosis present

## 2018-06-07 DIAGNOSIS — G459 Transient cerebral ischemic attack, unspecified: Secondary | ICD-10-CM | POA: Diagnosis not present

## 2018-06-07 DIAGNOSIS — R4182 Altered mental status, unspecified: Secondary | ICD-10-CM | POA: Diagnosis not present

## 2018-06-07 DIAGNOSIS — M6281 Muscle weakness (generalized): Secondary | ICD-10-CM | POA: Diagnosis not present

## 2018-06-07 DIAGNOSIS — R0902 Hypoxemia: Secondary | ICD-10-CM | POA: Diagnosis not present

## 2018-06-07 DIAGNOSIS — E039 Hypothyroidism, unspecified: Secondary | ICD-10-CM | POA: Diagnosis present

## 2018-06-07 DIAGNOSIS — I639 Cerebral infarction, unspecified: Secondary | ICD-10-CM | POA: Diagnosis present

## 2018-06-07 DIAGNOSIS — I110 Hypertensive heart disease with heart failure: Secondary | ICD-10-CM | POA: Diagnosis present

## 2018-06-07 DIAGNOSIS — I160 Hypertensive urgency: Secondary | ICD-10-CM | POA: Diagnosis present

## 2018-06-07 DIAGNOSIS — I674 Hypertensive encephalopathy: Secondary | ICD-10-CM | POA: Diagnosis present

## 2018-06-07 DIAGNOSIS — G8321 Monoplegia of upper limb affecting right dominant side: Secondary | ICD-10-CM | POA: Diagnosis present

## 2018-06-07 DIAGNOSIS — Z8673 Personal history of transient ischemic attack (TIA), and cerebral infarction without residual deficits: Secondary | ICD-10-CM | POA: Diagnosis not present

## 2018-06-07 DIAGNOSIS — K219 Gastro-esophageal reflux disease without esophagitis: Secondary | ICD-10-CM | POA: Diagnosis present

## 2018-06-07 DIAGNOSIS — E1142 Type 2 diabetes mellitus with diabetic polyneuropathy: Secondary | ICD-10-CM | POA: Diagnosis present

## 2018-06-07 DIAGNOSIS — R29818 Other symptoms and signs involving the nervous system: Secondary | ICD-10-CM | POA: Diagnosis not present

## 2018-06-07 DIAGNOSIS — Z7902 Long term (current) use of antithrombotics/antiplatelets: Secondary | ICD-10-CM | POA: Diagnosis not present

## 2018-06-07 DIAGNOSIS — R531 Weakness: Secondary | ICD-10-CM | POA: Diagnosis not present

## 2018-06-07 DIAGNOSIS — R7989 Other specified abnormal findings of blood chemistry: Secondary | ICD-10-CM | POA: Diagnosis not present

## 2018-06-07 DIAGNOSIS — E785 Hyperlipidemia, unspecified: Secondary | ICD-10-CM | POA: Diagnosis present

## 2018-06-07 DIAGNOSIS — I6930 Unspecified sequelae of cerebral infarction: Secondary | ICD-10-CM | POA: Diagnosis not present

## 2018-06-07 DIAGNOSIS — I69351 Hemiplegia and hemiparesis following cerebral infarction affecting right dominant side: Secondary | ICD-10-CM | POA: Diagnosis not present

## 2018-06-07 DIAGNOSIS — Z1159 Encounter for screening for other viral diseases: Secondary | ICD-10-CM | POA: Diagnosis not present

## 2018-06-07 DIAGNOSIS — L03114 Cellulitis of left upper limb: Secondary | ICD-10-CM | POA: Diagnosis present

## 2018-06-07 DIAGNOSIS — E1165 Type 2 diabetes mellitus with hyperglycemia: Secondary | ICD-10-CM | POA: Diagnosis present

## 2018-06-07 DIAGNOSIS — F0390 Unspecified dementia without behavioral disturbance: Secondary | ICD-10-CM | POA: Diagnosis present

## 2018-06-07 DIAGNOSIS — R262 Difficulty in walking, not elsewhere classified: Secondary | ICD-10-CM | POA: Diagnosis not present

## 2018-06-07 DIAGNOSIS — I10 Essential (primary) hypertension: Secondary | ICD-10-CM | POA: Diagnosis not present

## 2018-06-07 DIAGNOSIS — R51 Headache: Secondary | ICD-10-CM | POA: Diagnosis not present

## 2018-06-07 DIAGNOSIS — E1140 Type 2 diabetes mellitus with diabetic neuropathy, unspecified: Secondary | ICD-10-CM | POA: Diagnosis not present

## 2018-06-07 DIAGNOSIS — Z794 Long term (current) use of insulin: Secondary | ICD-10-CM | POA: Diagnosis not present

## 2018-06-08 DIAGNOSIS — I160 Hypertensive urgency: Secondary | ICD-10-CM | POA: Diagnosis not present

## 2018-06-08 DIAGNOSIS — G459 Transient cerebral ischemic attack, unspecified: Secondary | ICD-10-CM | POA: Diagnosis not present

## 2018-06-08 DIAGNOSIS — I639 Cerebral infarction, unspecified: Secondary | ICD-10-CM | POA: Diagnosis not present

## 2018-06-09 DIAGNOSIS — I639 Cerebral infarction, unspecified: Secondary | ICD-10-CM | POA: Diagnosis not present

## 2018-06-09 DIAGNOSIS — I160 Hypertensive urgency: Secondary | ICD-10-CM | POA: Diagnosis not present

## 2018-06-10 DIAGNOSIS — I69351 Hemiplegia and hemiparesis following cerebral infarction affecting right dominant side: Secondary | ICD-10-CM | POA: Diagnosis not present

## 2018-06-10 DIAGNOSIS — K5900 Constipation, unspecified: Secondary | ICD-10-CM | POA: Diagnosis not present

## 2018-06-10 DIAGNOSIS — G459 Transient cerebral ischemic attack, unspecified: Secondary | ICD-10-CM | POA: Diagnosis not present

## 2018-06-10 DIAGNOSIS — Z8673 Personal history of transient ischemic attack (TIA), and cerebral infarction without residual deficits: Secondary | ICD-10-CM | POA: Diagnosis not present

## 2018-06-10 DIAGNOSIS — L0391 Acute lymphangitis, unspecified: Secondary | ICD-10-CM | POA: Diagnosis not present

## 2018-06-10 DIAGNOSIS — I63449 Cerebral infarction due to embolism of unspecified cerebellar artery: Secondary | ICD-10-CM | POA: Diagnosis not present

## 2018-06-10 DIAGNOSIS — E785 Hyperlipidemia, unspecified: Secondary | ICD-10-CM | POA: Diagnosis not present

## 2018-06-10 DIAGNOSIS — E039 Hypothyroidism, unspecified: Secondary | ICD-10-CM | POA: Diagnosis not present

## 2018-06-10 DIAGNOSIS — I5032 Chronic diastolic (congestive) heart failure: Secondary | ICD-10-CM | POA: Diagnosis not present

## 2018-06-10 DIAGNOSIS — B309 Viral conjunctivitis, unspecified: Secondary | ICD-10-CM | POA: Diagnosis not present

## 2018-06-10 DIAGNOSIS — L03114 Cellulitis of left upper limb: Secondary | ICD-10-CM | POA: Diagnosis not present

## 2018-06-10 DIAGNOSIS — I639 Cerebral infarction, unspecified: Secondary | ICD-10-CM | POA: Diagnosis not present

## 2018-06-10 DIAGNOSIS — E119 Type 2 diabetes mellitus without complications: Secondary | ICD-10-CM | POA: Diagnosis not present

## 2018-06-10 DIAGNOSIS — R262 Difficulty in walking, not elsewhere classified: Secondary | ICD-10-CM | POA: Diagnosis not present

## 2018-06-10 DIAGNOSIS — I160 Hypertensive urgency: Secondary | ICD-10-CM | POA: Diagnosis not present

## 2018-06-10 DIAGNOSIS — M6281 Muscle weakness (generalized): Secondary | ICD-10-CM | POA: Diagnosis not present

## 2018-06-10 DIAGNOSIS — Z794 Long term (current) use of insulin: Secondary | ICD-10-CM | POA: Diagnosis not present

## 2018-06-10 DIAGNOSIS — I5030 Unspecified diastolic (congestive) heart failure: Secondary | ICD-10-CM | POA: Diagnosis not present

## 2018-06-10 DIAGNOSIS — F0390 Unspecified dementia without behavioral disturbance: Secondary | ICD-10-CM | POA: Diagnosis not present

## 2018-06-10 DIAGNOSIS — I6930 Unspecified sequelae of cerebral infarction: Secondary | ICD-10-CM | POA: Diagnosis not present

## 2018-06-10 DIAGNOSIS — E1140 Type 2 diabetes mellitus with diabetic neuropathy, unspecified: Secondary | ICD-10-CM | POA: Diagnosis not present

## 2018-06-10 DIAGNOSIS — L0390 Cellulitis, unspecified: Secondary | ICD-10-CM | POA: Diagnosis not present

## 2018-06-10 DIAGNOSIS — I10 Essential (primary) hypertension: Secondary | ICD-10-CM | POA: Diagnosis not present

## 2018-06-10 DIAGNOSIS — K219 Gastro-esophageal reflux disease without esophagitis: Secondary | ICD-10-CM | POA: Diagnosis not present

## 2018-06-13 DIAGNOSIS — I5030 Unspecified diastolic (congestive) heart failure: Secondary | ICD-10-CM | POA: Diagnosis not present

## 2018-06-13 DIAGNOSIS — F0390 Unspecified dementia without behavioral disturbance: Secondary | ICD-10-CM | POA: Diagnosis not present

## 2018-06-13 DIAGNOSIS — G459 Transient cerebral ischemic attack, unspecified: Secondary | ICD-10-CM | POA: Diagnosis not present

## 2018-06-13 DIAGNOSIS — L0391 Acute lymphangitis, unspecified: Secondary | ICD-10-CM | POA: Diagnosis not present

## 2018-06-14 DIAGNOSIS — I5030 Unspecified diastolic (congestive) heart failure: Secondary | ICD-10-CM | POA: Diagnosis not present

## 2018-06-14 DIAGNOSIS — L0390 Cellulitis, unspecified: Secondary | ICD-10-CM | POA: Diagnosis not present

## 2018-06-14 DIAGNOSIS — F0390 Unspecified dementia without behavioral disturbance: Secondary | ICD-10-CM | POA: Diagnosis not present

## 2018-06-14 DIAGNOSIS — I63449 Cerebral infarction due to embolism of unspecified cerebellar artery: Secondary | ICD-10-CM | POA: Diagnosis not present

## 2018-06-16 DIAGNOSIS — F0390 Unspecified dementia without behavioral disturbance: Secondary | ICD-10-CM | POA: Diagnosis not present

## 2018-06-16 DIAGNOSIS — G459 Transient cerebral ischemic attack, unspecified: Secondary | ICD-10-CM | POA: Diagnosis not present

## 2018-06-21 DIAGNOSIS — F0390 Unspecified dementia without behavioral disturbance: Secondary | ICD-10-CM | POA: Diagnosis not present

## 2018-06-21 DIAGNOSIS — I5030 Unspecified diastolic (congestive) heart failure: Secondary | ICD-10-CM | POA: Diagnosis not present

## 2018-06-21 DIAGNOSIS — G459 Transient cerebral ischemic attack, unspecified: Secondary | ICD-10-CM | POA: Diagnosis not present

## 2018-06-21 DIAGNOSIS — L0390 Cellulitis, unspecified: Secondary | ICD-10-CM | POA: Diagnosis not present

## 2018-06-23 DIAGNOSIS — F0390 Unspecified dementia without behavioral disturbance: Secondary | ICD-10-CM | POA: Diagnosis not present

## 2018-06-23 DIAGNOSIS — K5900 Constipation, unspecified: Secondary | ICD-10-CM | POA: Diagnosis not present

## 2018-06-27 DIAGNOSIS — I69351 Hemiplegia and hemiparesis following cerebral infarction affecting right dominant side: Secondary | ICD-10-CM | POA: Diagnosis not present

## 2018-06-27 DIAGNOSIS — F0390 Unspecified dementia without behavioral disturbance: Secondary | ICD-10-CM | POA: Diagnosis not present

## 2018-06-27 DIAGNOSIS — Z8673 Personal history of transient ischemic attack (TIA), and cerebral infarction without residual deficits: Secondary | ICD-10-CM | POA: Diagnosis not present

## 2018-06-27 DIAGNOSIS — L03114 Cellulitis of left upper limb: Secondary | ICD-10-CM | POA: Diagnosis not present

## 2018-06-27 DIAGNOSIS — I639 Cerebral infarction, unspecified: Secondary | ICD-10-CM | POA: Diagnosis not present

## 2018-06-27 DIAGNOSIS — L0390 Cellulitis, unspecified: Secondary | ICD-10-CM | POA: Diagnosis not present

## 2018-06-27 DIAGNOSIS — E1140 Type 2 diabetes mellitus with diabetic neuropathy, unspecified: Secondary | ICD-10-CM | POA: Diagnosis not present

## 2018-06-27 DIAGNOSIS — G459 Transient cerebral ischemic attack, unspecified: Secondary | ICD-10-CM | POA: Diagnosis not present

## 2018-06-27 DIAGNOSIS — Z794 Long term (current) use of insulin: Secondary | ICD-10-CM | POA: Diagnosis not present

## 2018-06-27 DIAGNOSIS — E785 Hyperlipidemia, unspecified: Secondary | ICD-10-CM | POA: Diagnosis not present

## 2018-06-27 DIAGNOSIS — I10 Essential (primary) hypertension: Secondary | ICD-10-CM | POA: Diagnosis not present

## 2018-06-27 DIAGNOSIS — M6281 Muscle weakness (generalized): Secondary | ICD-10-CM | POA: Diagnosis not present

## 2018-06-27 DIAGNOSIS — K5900 Constipation, unspecified: Secondary | ICD-10-CM | POA: Diagnosis not present

## 2018-06-27 DIAGNOSIS — R262 Difficulty in walking, not elsewhere classified: Secondary | ICD-10-CM | POA: Diagnosis not present

## 2018-06-27 DIAGNOSIS — E039 Hypothyroidism, unspecified: Secondary | ICD-10-CM | POA: Diagnosis not present

## 2018-06-27 DIAGNOSIS — I160 Hypertensive urgency: Secondary | ICD-10-CM | POA: Diagnosis not present

## 2018-06-27 DIAGNOSIS — K219 Gastro-esophageal reflux disease without esophagitis: Secondary | ICD-10-CM | POA: Diagnosis not present

## 2018-06-27 DIAGNOSIS — I5030 Unspecified diastolic (congestive) heart failure: Secondary | ICD-10-CM | POA: Diagnosis not present

## 2018-06-27 DIAGNOSIS — B309 Viral conjunctivitis, unspecified: Secondary | ICD-10-CM | POA: Diagnosis not present

## 2018-06-27 DIAGNOSIS — E119 Type 2 diabetes mellitus without complications: Secondary | ICD-10-CM | POA: Diagnosis not present

## 2018-06-27 DIAGNOSIS — I5032 Chronic diastolic (congestive) heart failure: Secondary | ICD-10-CM | POA: Diagnosis not present

## 2018-06-28 DIAGNOSIS — I10 Essential (primary) hypertension: Secondary | ICD-10-CM | POA: Diagnosis not present

## 2018-06-28 DIAGNOSIS — E119 Type 2 diabetes mellitus without complications: Secondary | ICD-10-CM | POA: Diagnosis not present

## 2018-06-28 DIAGNOSIS — F0390 Unspecified dementia without behavioral disturbance: Secondary | ICD-10-CM | POA: Diagnosis not present

## 2018-06-28 DIAGNOSIS — I5030 Unspecified diastolic (congestive) heart failure: Secondary | ICD-10-CM | POA: Diagnosis not present

## 2018-06-29 DIAGNOSIS — E119 Type 2 diabetes mellitus without complications: Secondary | ICD-10-CM | POA: Diagnosis not present

## 2018-06-29 DIAGNOSIS — F0390 Unspecified dementia without behavioral disturbance: Secondary | ICD-10-CM | POA: Diagnosis not present

## 2018-06-29 DIAGNOSIS — K5900 Constipation, unspecified: Secondary | ICD-10-CM | POA: Diagnosis not present

## 2018-06-30 DIAGNOSIS — I10 Essential (primary) hypertension: Secondary | ICD-10-CM | POA: Diagnosis not present

## 2018-06-30 DIAGNOSIS — I5030 Unspecified diastolic (congestive) heart failure: Secondary | ICD-10-CM | POA: Diagnosis not present

## 2018-06-30 DIAGNOSIS — F0390 Unspecified dementia without behavioral disturbance: Secondary | ICD-10-CM | POA: Diagnosis not present

## 2018-06-30 DIAGNOSIS — E119 Type 2 diabetes mellitus without complications: Secondary | ICD-10-CM | POA: Diagnosis not present

## 2018-07-05 DIAGNOSIS — E119 Type 2 diabetes mellitus without complications: Secondary | ICD-10-CM | POA: Diagnosis not present

## 2018-07-05 DIAGNOSIS — F0390 Unspecified dementia without behavioral disturbance: Secondary | ICD-10-CM | POA: Diagnosis not present

## 2018-07-07 DIAGNOSIS — L0390 Cellulitis, unspecified: Secondary | ICD-10-CM | POA: Diagnosis not present

## 2018-07-07 DIAGNOSIS — F0390 Unspecified dementia without behavioral disturbance: Secondary | ICD-10-CM | POA: Diagnosis not present

## 2018-07-07 DIAGNOSIS — I5030 Unspecified diastolic (congestive) heart failure: Secondary | ICD-10-CM | POA: Diagnosis not present

## 2018-07-12 DIAGNOSIS — I10 Essential (primary) hypertension: Secondary | ICD-10-CM | POA: Diagnosis not present

## 2018-07-12 DIAGNOSIS — F0390 Unspecified dementia without behavioral disturbance: Secondary | ICD-10-CM | POA: Diagnosis not present

## 2018-07-12 DIAGNOSIS — E119 Type 2 diabetes mellitus without complications: Secondary | ICD-10-CM | POA: Diagnosis not present

## 2018-07-12 DIAGNOSIS — G459 Transient cerebral ischemic attack, unspecified: Secondary | ICD-10-CM | POA: Diagnosis not present

## 2018-07-19 DIAGNOSIS — F0390 Unspecified dementia without behavioral disturbance: Secondary | ICD-10-CM | POA: Diagnosis not present

## 2018-07-19 DIAGNOSIS — I5030 Unspecified diastolic (congestive) heart failure: Secondary | ICD-10-CM | POA: Diagnosis not present

## 2018-07-19 DIAGNOSIS — L0390 Cellulitis, unspecified: Secondary | ICD-10-CM | POA: Diagnosis not present

## 2018-07-19 DIAGNOSIS — G459 Transient cerebral ischemic attack, unspecified: Secondary | ICD-10-CM | POA: Diagnosis not present

## 2018-08-02 DIAGNOSIS — B309 Viral conjunctivitis, unspecified: Secondary | ICD-10-CM | POA: Diagnosis not present

## 2018-08-03 DIAGNOSIS — F0390 Unspecified dementia without behavioral disturbance: Secondary | ICD-10-CM | POA: Diagnosis not present

## 2018-08-03 DIAGNOSIS — L0390 Cellulitis, unspecified: Secondary | ICD-10-CM | POA: Diagnosis not present

## 2018-08-03 DIAGNOSIS — I5030 Unspecified diastolic (congestive) heart failure: Secondary | ICD-10-CM | POA: Diagnosis not present

## 2018-08-03 DIAGNOSIS — G459 Transient cerebral ischemic attack, unspecified: Secondary | ICD-10-CM | POA: Diagnosis not present

## 2018-08-04 DIAGNOSIS — Z8673 Personal history of transient ischemic attack (TIA), and cerebral infarction without residual deficits: Secondary | ICD-10-CM | POA: Diagnosis not present

## 2018-08-04 DIAGNOSIS — L03114 Cellulitis of left upper limb: Secondary | ICD-10-CM | POA: Diagnosis not present

## 2018-08-04 DIAGNOSIS — Z7902 Long term (current) use of antithrombotics/antiplatelets: Secondary | ICD-10-CM | POA: Diagnosis not present

## 2018-08-04 DIAGNOSIS — K219 Gastro-esophageal reflux disease without esophagitis: Secondary | ICD-10-CM | POA: Diagnosis not present

## 2018-08-04 DIAGNOSIS — I110 Hypertensive heart disease with heart failure: Secondary | ICD-10-CM | POA: Diagnosis not present

## 2018-08-04 DIAGNOSIS — E785 Hyperlipidemia, unspecified: Secondary | ICD-10-CM | POA: Diagnosis not present

## 2018-08-04 DIAGNOSIS — E039 Hypothyroidism, unspecified: Secondary | ICD-10-CM | POA: Diagnosis not present

## 2018-08-04 DIAGNOSIS — Z7984 Long term (current) use of oral hypoglycemic drugs: Secondary | ICD-10-CM | POA: Diagnosis not present

## 2018-08-04 DIAGNOSIS — I5032 Chronic diastolic (congestive) heart failure: Secondary | ICD-10-CM | POA: Diagnosis not present

## 2018-08-04 DIAGNOSIS — E1142 Type 2 diabetes mellitus with diabetic polyneuropathy: Secondary | ICD-10-CM | POA: Diagnosis not present

## 2018-08-04 DIAGNOSIS — Z86711 Personal history of pulmonary embolism: Secondary | ICD-10-CM | POA: Diagnosis not present

## 2018-08-04 DIAGNOSIS — Z9181 History of falling: Secondary | ICD-10-CM | POA: Diagnosis not present

## 2018-08-04 DIAGNOSIS — I10 Essential (primary) hypertension: Secondary | ICD-10-CM | POA: Diagnosis not present

## 2018-08-04 DIAGNOSIS — F0280 Dementia in other diseases classified elsewhere without behavioral disturbance: Secondary | ICD-10-CM | POA: Diagnosis not present

## 2018-08-04 DIAGNOSIS — I272 Pulmonary hypertension, unspecified: Secondary | ICD-10-CM | POA: Diagnosis not present

## 2018-08-04 DIAGNOSIS — G8191 Hemiplegia, unspecified affecting right dominant side: Secondary | ICD-10-CM | POA: Diagnosis not present

## 2018-08-05 DIAGNOSIS — I110 Hypertensive heart disease with heart failure: Secondary | ICD-10-CM | POA: Diagnosis not present

## 2018-08-05 DIAGNOSIS — L03114 Cellulitis of left upper limb: Secondary | ICD-10-CM | POA: Diagnosis not present

## 2018-08-05 DIAGNOSIS — E1142 Type 2 diabetes mellitus with diabetic polyneuropathy: Secondary | ICD-10-CM | POA: Diagnosis not present

## 2018-08-05 DIAGNOSIS — F0280 Dementia in other diseases classified elsewhere without behavioral disturbance: Secondary | ICD-10-CM | POA: Diagnosis not present

## 2018-08-05 DIAGNOSIS — E039 Hypothyroidism, unspecified: Secondary | ICD-10-CM | POA: Diagnosis not present

## 2018-08-05 DIAGNOSIS — I5032 Chronic diastolic (congestive) heart failure: Secondary | ICD-10-CM | POA: Diagnosis not present

## 2018-08-08 DIAGNOSIS — L03114 Cellulitis of left upper limb: Secondary | ICD-10-CM | POA: Diagnosis not present

## 2018-08-08 DIAGNOSIS — E1142 Type 2 diabetes mellitus with diabetic polyneuropathy: Secondary | ICD-10-CM | POA: Diagnosis not present

## 2018-08-08 DIAGNOSIS — I5032 Chronic diastolic (congestive) heart failure: Secondary | ICD-10-CM | POA: Diagnosis not present

## 2018-08-08 DIAGNOSIS — E039 Hypothyroidism, unspecified: Secondary | ICD-10-CM | POA: Diagnosis not present

## 2018-08-08 DIAGNOSIS — F0280 Dementia in other diseases classified elsewhere without behavioral disturbance: Secondary | ICD-10-CM | POA: Diagnosis not present

## 2018-08-08 DIAGNOSIS — I110 Hypertensive heart disease with heart failure: Secondary | ICD-10-CM | POA: Diagnosis not present

## 2018-08-09 DIAGNOSIS — F0280 Dementia in other diseases classified elsewhere without behavioral disturbance: Secondary | ICD-10-CM | POA: Diagnosis not present

## 2018-08-09 DIAGNOSIS — I5032 Chronic diastolic (congestive) heart failure: Secondary | ICD-10-CM | POA: Diagnosis not present

## 2018-08-09 DIAGNOSIS — L03114 Cellulitis of left upper limb: Secondary | ICD-10-CM | POA: Diagnosis not present

## 2018-08-09 DIAGNOSIS — E039 Hypothyroidism, unspecified: Secondary | ICD-10-CM | POA: Diagnosis not present

## 2018-08-09 DIAGNOSIS — E1142 Type 2 diabetes mellitus with diabetic polyneuropathy: Secondary | ICD-10-CM | POA: Diagnosis not present

## 2018-08-09 DIAGNOSIS — I110 Hypertensive heart disease with heart failure: Secondary | ICD-10-CM | POA: Diagnosis not present

## 2018-08-10 DIAGNOSIS — F0280 Dementia in other diseases classified elsewhere without behavioral disturbance: Secondary | ICD-10-CM | POA: Diagnosis not present

## 2018-08-10 DIAGNOSIS — E1142 Type 2 diabetes mellitus with diabetic polyneuropathy: Secondary | ICD-10-CM | POA: Diagnosis not present

## 2018-08-10 DIAGNOSIS — E039 Hypothyroidism, unspecified: Secondary | ICD-10-CM | POA: Diagnosis not present

## 2018-08-10 DIAGNOSIS — L03114 Cellulitis of left upper limb: Secondary | ICD-10-CM | POA: Diagnosis not present

## 2018-08-10 DIAGNOSIS — I110 Hypertensive heart disease with heart failure: Secondary | ICD-10-CM | POA: Diagnosis not present

## 2018-08-10 DIAGNOSIS — I5032 Chronic diastolic (congestive) heart failure: Secondary | ICD-10-CM | POA: Diagnosis not present

## 2018-08-11 DIAGNOSIS — E1142 Type 2 diabetes mellitus with diabetic polyneuropathy: Secondary | ICD-10-CM | POA: Diagnosis not present

## 2018-08-11 DIAGNOSIS — L03114 Cellulitis of left upper limb: Secondary | ICD-10-CM | POA: Diagnosis not present

## 2018-08-11 DIAGNOSIS — I5032 Chronic diastolic (congestive) heart failure: Secondary | ICD-10-CM | POA: Diagnosis not present

## 2018-08-11 DIAGNOSIS — E039 Hypothyroidism, unspecified: Secondary | ICD-10-CM | POA: Diagnosis not present

## 2018-08-11 DIAGNOSIS — I110 Hypertensive heart disease with heart failure: Secondary | ICD-10-CM | POA: Diagnosis not present

## 2018-08-11 DIAGNOSIS — F0280 Dementia in other diseases classified elsewhere without behavioral disturbance: Secondary | ICD-10-CM | POA: Diagnosis not present

## 2018-08-15 DIAGNOSIS — E039 Hypothyroidism, unspecified: Secondary | ICD-10-CM | POA: Diagnosis not present

## 2018-08-15 DIAGNOSIS — I5032 Chronic diastolic (congestive) heart failure: Secondary | ICD-10-CM | POA: Diagnosis not present

## 2018-08-15 DIAGNOSIS — L03114 Cellulitis of left upper limb: Secondary | ICD-10-CM | POA: Diagnosis not present

## 2018-08-15 DIAGNOSIS — F0280 Dementia in other diseases classified elsewhere without behavioral disturbance: Secondary | ICD-10-CM | POA: Diagnosis not present

## 2018-08-15 DIAGNOSIS — I110 Hypertensive heart disease with heart failure: Secondary | ICD-10-CM | POA: Diagnosis not present

## 2018-08-15 DIAGNOSIS — E1142 Type 2 diabetes mellitus with diabetic polyneuropathy: Secondary | ICD-10-CM | POA: Diagnosis not present

## 2018-08-17 DIAGNOSIS — E039 Hypothyroidism, unspecified: Secondary | ICD-10-CM | POA: Diagnosis not present

## 2018-08-17 DIAGNOSIS — F0280 Dementia in other diseases classified elsewhere without behavioral disturbance: Secondary | ICD-10-CM | POA: Diagnosis not present

## 2018-08-17 DIAGNOSIS — I5032 Chronic diastolic (congestive) heart failure: Secondary | ICD-10-CM | POA: Diagnosis not present

## 2018-08-17 DIAGNOSIS — I110 Hypertensive heart disease with heart failure: Secondary | ICD-10-CM | POA: Diagnosis not present

## 2018-08-17 DIAGNOSIS — L03114 Cellulitis of left upper limb: Secondary | ICD-10-CM | POA: Diagnosis not present

## 2018-08-17 DIAGNOSIS — E1142 Type 2 diabetes mellitus with diabetic polyneuropathy: Secondary | ICD-10-CM | POA: Diagnosis not present

## 2018-08-18 DIAGNOSIS — E1142 Type 2 diabetes mellitus with diabetic polyneuropathy: Secondary | ICD-10-CM | POA: Diagnosis not present

## 2018-08-18 DIAGNOSIS — K219 Gastro-esophageal reflux disease without esophagitis: Secondary | ICD-10-CM | POA: Diagnosis not present

## 2018-08-18 DIAGNOSIS — H109 Unspecified conjunctivitis: Secondary | ICD-10-CM | POA: Diagnosis not present

## 2018-08-18 DIAGNOSIS — I10 Essential (primary) hypertension: Secondary | ICD-10-CM | POA: Diagnosis not present

## 2018-08-18 DIAGNOSIS — E039 Hypothyroidism, unspecified: Secondary | ICD-10-CM | POA: Diagnosis not present

## 2018-08-18 DIAGNOSIS — F0280 Dementia in other diseases classified elsewhere without behavioral disturbance: Secondary | ICD-10-CM | POA: Diagnosis not present

## 2018-08-18 DIAGNOSIS — L03114 Cellulitis of left upper limb: Secondary | ICD-10-CM | POA: Diagnosis not present

## 2018-08-18 DIAGNOSIS — I639 Cerebral infarction, unspecified: Secondary | ICD-10-CM | POA: Diagnosis not present

## 2018-08-18 DIAGNOSIS — E0865 Diabetes mellitus due to underlying condition with hyperglycemia: Secondary | ICD-10-CM | POA: Diagnosis not present

## 2018-08-18 DIAGNOSIS — I110 Hypertensive heart disease with heart failure: Secondary | ICD-10-CM | POA: Diagnosis not present

## 2018-08-18 DIAGNOSIS — I5032 Chronic diastolic (congestive) heart failure: Secondary | ICD-10-CM | POA: Diagnosis not present

## 2018-08-18 DIAGNOSIS — L719 Rosacea, unspecified: Secondary | ICD-10-CM | POA: Diagnosis not present

## 2018-08-18 DIAGNOSIS — I7101 Dissection of thoracic aorta: Secondary | ICD-10-CM | POA: Diagnosis not present

## 2018-08-22 DIAGNOSIS — E039 Hypothyroidism, unspecified: Secondary | ICD-10-CM | POA: Diagnosis not present

## 2018-08-22 DIAGNOSIS — I5032 Chronic diastolic (congestive) heart failure: Secondary | ICD-10-CM | POA: Diagnosis not present

## 2018-08-22 DIAGNOSIS — E1142 Type 2 diabetes mellitus with diabetic polyneuropathy: Secondary | ICD-10-CM | POA: Diagnosis not present

## 2018-08-22 DIAGNOSIS — F0280 Dementia in other diseases classified elsewhere without behavioral disturbance: Secondary | ICD-10-CM | POA: Diagnosis not present

## 2018-08-22 DIAGNOSIS — I110 Hypertensive heart disease with heart failure: Secondary | ICD-10-CM | POA: Diagnosis not present

## 2018-08-22 DIAGNOSIS — L03114 Cellulitis of left upper limb: Secondary | ICD-10-CM | POA: Diagnosis not present

## 2018-08-24 DIAGNOSIS — E039 Hypothyroidism, unspecified: Secondary | ICD-10-CM | POA: Diagnosis not present

## 2018-08-24 DIAGNOSIS — I5032 Chronic diastolic (congestive) heart failure: Secondary | ICD-10-CM | POA: Diagnosis not present

## 2018-08-24 DIAGNOSIS — I110 Hypertensive heart disease with heart failure: Secondary | ICD-10-CM | POA: Diagnosis not present

## 2018-08-24 DIAGNOSIS — E1142 Type 2 diabetes mellitus with diabetic polyneuropathy: Secondary | ICD-10-CM | POA: Diagnosis not present

## 2018-08-24 DIAGNOSIS — F0280 Dementia in other diseases classified elsewhere without behavioral disturbance: Secondary | ICD-10-CM | POA: Diagnosis not present

## 2018-08-24 DIAGNOSIS — L03114 Cellulitis of left upper limb: Secondary | ICD-10-CM | POA: Diagnosis not present

## 2018-08-25 DIAGNOSIS — J9621 Acute and chronic respiratory failure with hypoxia: Secondary | ICD-10-CM | POA: Diagnosis not present

## 2018-08-25 DIAGNOSIS — E785 Hyperlipidemia, unspecified: Secondary | ICD-10-CM | POA: Diagnosis present

## 2018-08-25 DIAGNOSIS — Z91048 Other nonmedicinal substance allergy status: Secondary | ICD-10-CM | POA: Diagnosis not present

## 2018-08-25 DIAGNOSIS — I639 Cerebral infarction, unspecified: Secondary | ICD-10-CM | POA: Diagnosis not present

## 2018-08-25 DIAGNOSIS — I959 Hypotension, unspecified: Secondary | ICD-10-CM | POA: Diagnosis not present

## 2018-08-25 DIAGNOSIS — J984 Other disorders of lung: Secondary | ICD-10-CM | POA: Diagnosis present

## 2018-08-25 DIAGNOSIS — E871 Hypo-osmolality and hyponatremia: Secondary | ICD-10-CM | POA: Diagnosis present

## 2018-08-25 DIAGNOSIS — J9601 Acute respiratory failure with hypoxia: Secondary | ICD-10-CM | POA: Diagnosis not present

## 2018-08-25 DIAGNOSIS — J9691 Respiratory failure, unspecified with hypoxia: Secondary | ICD-10-CM | POA: Diagnosis not present

## 2018-08-25 DIAGNOSIS — I110 Hypertensive heart disease with heart failure: Secondary | ICD-10-CM | POA: Diagnosis present

## 2018-08-25 DIAGNOSIS — Z1159 Encounter for screening for other viral diseases: Secondary | ICD-10-CM | POA: Diagnosis not present

## 2018-08-25 DIAGNOSIS — R0902 Hypoxemia: Secondary | ICD-10-CM | POA: Diagnosis not present

## 2018-08-25 DIAGNOSIS — E039 Hypothyroidism, unspecified: Secondary | ICD-10-CM | POA: Diagnosis present

## 2018-08-25 DIAGNOSIS — R4182 Altered mental status, unspecified: Secondary | ICD-10-CM | POA: Diagnosis present

## 2018-08-25 DIAGNOSIS — Z7984 Long term (current) use of oral hypoglycemic drugs: Secondary | ICD-10-CM | POA: Diagnosis not present

## 2018-08-25 DIAGNOSIS — Z7902 Long term (current) use of antithrombotics/antiplatelets: Secondary | ICD-10-CM | POA: Diagnosis not present

## 2018-08-25 DIAGNOSIS — K219 Gastro-esophageal reflux disease without esophagitis: Secondary | ICD-10-CM | POA: Diagnosis present

## 2018-08-25 DIAGNOSIS — F0390 Unspecified dementia without behavioral disturbance: Secondary | ICD-10-CM | POA: Diagnosis present

## 2018-08-25 DIAGNOSIS — E1165 Type 2 diabetes mellitus with hyperglycemia: Secondary | ICD-10-CM | POA: Diagnosis present

## 2018-08-25 DIAGNOSIS — Z8673 Personal history of transient ischemic attack (TIA), and cerebral infarction without residual deficits: Secondary | ICD-10-CM | POA: Diagnosis not present

## 2018-08-25 DIAGNOSIS — I5032 Chronic diastolic (congestive) heart failure: Secondary | ICD-10-CM | POA: Diagnosis present

## 2018-08-25 DIAGNOSIS — R531 Weakness: Secondary | ICD-10-CM | POA: Diagnosis not present

## 2018-08-25 DIAGNOSIS — E1142 Type 2 diabetes mellitus with diabetic polyneuropathy: Secondary | ICD-10-CM | POA: Diagnosis present

## 2018-08-25 DIAGNOSIS — R410 Disorientation, unspecified: Secondary | ICD-10-CM | POA: Diagnosis not present

## 2018-08-25 DIAGNOSIS — L03114 Cellulitis of left upper limb: Secondary | ICD-10-CM | POA: Diagnosis not present

## 2018-08-25 DIAGNOSIS — Z79899 Other long term (current) drug therapy: Secondary | ICD-10-CM | POA: Diagnosis not present

## 2018-08-30 DIAGNOSIS — L03114 Cellulitis of left upper limb: Secondary | ICD-10-CM | POA: Diagnosis not present

## 2018-08-30 DIAGNOSIS — I5032 Chronic diastolic (congestive) heart failure: Secondary | ICD-10-CM | POA: Diagnosis not present

## 2018-08-30 DIAGNOSIS — F0280 Dementia in other diseases classified elsewhere without behavioral disturbance: Secondary | ICD-10-CM | POA: Diagnosis not present

## 2018-08-30 DIAGNOSIS — I110 Hypertensive heart disease with heart failure: Secondary | ICD-10-CM | POA: Diagnosis not present

## 2018-08-30 DIAGNOSIS — E1142 Type 2 diabetes mellitus with diabetic polyneuropathy: Secondary | ICD-10-CM | POA: Diagnosis not present

## 2018-08-30 DIAGNOSIS — E039 Hypothyroidism, unspecified: Secondary | ICD-10-CM | POA: Diagnosis not present

## 2018-09-02 DIAGNOSIS — E039 Hypothyroidism, unspecified: Secondary | ICD-10-CM | POA: Diagnosis not present

## 2018-09-02 DIAGNOSIS — I5032 Chronic diastolic (congestive) heart failure: Secondary | ICD-10-CM | POA: Diagnosis not present

## 2018-09-02 DIAGNOSIS — F0280 Dementia in other diseases classified elsewhere without behavioral disturbance: Secondary | ICD-10-CM | POA: Diagnosis not present

## 2018-09-02 DIAGNOSIS — I110 Hypertensive heart disease with heart failure: Secondary | ICD-10-CM | POA: Diagnosis not present

## 2018-09-02 DIAGNOSIS — L03114 Cellulitis of left upper limb: Secondary | ICD-10-CM | POA: Diagnosis not present

## 2018-09-02 DIAGNOSIS — E1142 Type 2 diabetes mellitus with diabetic polyneuropathy: Secondary | ICD-10-CM | POA: Diagnosis not present

## 2018-09-03 DIAGNOSIS — Z7984 Long term (current) use of oral hypoglycemic drugs: Secondary | ICD-10-CM | POA: Diagnosis not present

## 2018-09-03 DIAGNOSIS — Z86711 Personal history of pulmonary embolism: Secondary | ICD-10-CM | POA: Diagnosis not present

## 2018-09-03 DIAGNOSIS — E785 Hyperlipidemia, unspecified: Secondary | ICD-10-CM | POA: Diagnosis not present

## 2018-09-03 DIAGNOSIS — Z9181 History of falling: Secondary | ICD-10-CM | POA: Diagnosis not present

## 2018-09-03 DIAGNOSIS — G8191 Hemiplegia, unspecified affecting right dominant side: Secondary | ICD-10-CM | POA: Diagnosis not present

## 2018-09-03 DIAGNOSIS — Z7902 Long term (current) use of antithrombotics/antiplatelets: Secondary | ICD-10-CM | POA: Diagnosis not present

## 2018-09-03 DIAGNOSIS — Z8673 Personal history of transient ischemic attack (TIA), and cerebral infarction without residual deficits: Secondary | ICD-10-CM | POA: Diagnosis not present

## 2018-09-03 DIAGNOSIS — E1142 Type 2 diabetes mellitus with diabetic polyneuropathy: Secondary | ICD-10-CM | POA: Diagnosis not present

## 2018-09-03 DIAGNOSIS — I5032 Chronic diastolic (congestive) heart failure: Secondary | ICD-10-CM | POA: Diagnosis not present

## 2018-09-03 DIAGNOSIS — I272 Pulmonary hypertension, unspecified: Secondary | ICD-10-CM | POA: Diagnosis not present

## 2018-09-03 DIAGNOSIS — E039 Hypothyroidism, unspecified: Secondary | ICD-10-CM | POA: Diagnosis not present

## 2018-09-03 DIAGNOSIS — I110 Hypertensive heart disease with heart failure: Secondary | ICD-10-CM | POA: Diagnosis not present

## 2018-09-03 DIAGNOSIS — F0280 Dementia in other diseases classified elsewhere without behavioral disturbance: Secondary | ICD-10-CM | POA: Diagnosis not present

## 2018-09-03 DIAGNOSIS — K219 Gastro-esophageal reflux disease without esophagitis: Secondary | ICD-10-CM | POA: Diagnosis not present

## 2018-09-03 DIAGNOSIS — L03114 Cellulitis of left upper limb: Secondary | ICD-10-CM | POA: Diagnosis not present

## 2018-09-05 DIAGNOSIS — F0280 Dementia in other diseases classified elsewhere without behavioral disturbance: Secondary | ICD-10-CM | POA: Diagnosis not present

## 2018-09-05 DIAGNOSIS — E039 Hypothyroidism, unspecified: Secondary | ICD-10-CM | POA: Diagnosis not present

## 2018-09-05 DIAGNOSIS — L03114 Cellulitis of left upper limb: Secondary | ICD-10-CM | POA: Diagnosis not present

## 2018-09-05 DIAGNOSIS — I110 Hypertensive heart disease with heart failure: Secondary | ICD-10-CM | POA: Diagnosis not present

## 2018-09-05 DIAGNOSIS — I5032 Chronic diastolic (congestive) heart failure: Secondary | ICD-10-CM | POA: Diagnosis not present

## 2018-09-05 DIAGNOSIS — E1142 Type 2 diabetes mellitus with diabetic polyneuropathy: Secondary | ICD-10-CM | POA: Diagnosis not present

## 2018-09-06 DIAGNOSIS — L03114 Cellulitis of left upper limb: Secondary | ICD-10-CM | POA: Diagnosis not present

## 2018-09-06 DIAGNOSIS — I5032 Chronic diastolic (congestive) heart failure: Secondary | ICD-10-CM | POA: Diagnosis not present

## 2018-09-06 DIAGNOSIS — E039 Hypothyroidism, unspecified: Secondary | ICD-10-CM | POA: Diagnosis not present

## 2018-09-06 DIAGNOSIS — E1142 Type 2 diabetes mellitus with diabetic polyneuropathy: Secondary | ICD-10-CM | POA: Diagnosis not present

## 2018-09-06 DIAGNOSIS — I110 Hypertensive heart disease with heart failure: Secondary | ICD-10-CM | POA: Diagnosis not present

## 2018-09-06 DIAGNOSIS — F0280 Dementia in other diseases classified elsewhere without behavioral disturbance: Secondary | ICD-10-CM | POA: Diagnosis not present

## 2018-09-08 DIAGNOSIS — L03114 Cellulitis of left upper limb: Secondary | ICD-10-CM | POA: Diagnosis not present

## 2018-09-08 DIAGNOSIS — E039 Hypothyroidism, unspecified: Secondary | ICD-10-CM | POA: Diagnosis not present

## 2018-09-08 DIAGNOSIS — I5032 Chronic diastolic (congestive) heart failure: Secondary | ICD-10-CM | POA: Diagnosis not present

## 2018-09-08 DIAGNOSIS — E1142 Type 2 diabetes mellitus with diabetic polyneuropathy: Secondary | ICD-10-CM | POA: Diagnosis not present

## 2018-09-08 DIAGNOSIS — I110 Hypertensive heart disease with heart failure: Secondary | ICD-10-CM | POA: Diagnosis not present

## 2018-09-08 DIAGNOSIS — F0280 Dementia in other diseases classified elsewhere without behavioral disturbance: Secondary | ICD-10-CM | POA: Diagnosis not present

## 2018-09-12 DIAGNOSIS — L03114 Cellulitis of left upper limb: Secondary | ICD-10-CM | POA: Diagnosis not present

## 2018-09-12 DIAGNOSIS — F0280 Dementia in other diseases classified elsewhere without behavioral disturbance: Secondary | ICD-10-CM | POA: Diagnosis not present

## 2018-09-12 DIAGNOSIS — I5032 Chronic diastolic (congestive) heart failure: Secondary | ICD-10-CM | POA: Diagnosis not present

## 2018-09-12 DIAGNOSIS — I110 Hypertensive heart disease with heart failure: Secondary | ICD-10-CM | POA: Diagnosis not present

## 2018-09-12 DIAGNOSIS — E039 Hypothyroidism, unspecified: Secondary | ICD-10-CM | POA: Diagnosis not present

## 2018-09-12 DIAGNOSIS — E1142 Type 2 diabetes mellitus with diabetic polyneuropathy: Secondary | ICD-10-CM | POA: Diagnosis not present

## 2018-09-13 DIAGNOSIS — I5032 Chronic diastolic (congestive) heart failure: Secondary | ICD-10-CM | POA: Diagnosis not present

## 2018-09-13 DIAGNOSIS — L03114 Cellulitis of left upper limb: Secondary | ICD-10-CM | POA: Diagnosis not present

## 2018-09-13 DIAGNOSIS — I110 Hypertensive heart disease with heart failure: Secondary | ICD-10-CM | POA: Diagnosis not present

## 2018-09-13 DIAGNOSIS — F0280 Dementia in other diseases classified elsewhere without behavioral disturbance: Secondary | ICD-10-CM | POA: Diagnosis not present

## 2018-09-13 DIAGNOSIS — E1142 Type 2 diabetes mellitus with diabetic polyneuropathy: Secondary | ICD-10-CM | POA: Diagnosis not present

## 2018-09-13 DIAGNOSIS — E039 Hypothyroidism, unspecified: Secondary | ICD-10-CM | POA: Diagnosis not present

## 2018-09-14 DIAGNOSIS — F0280 Dementia in other diseases classified elsewhere without behavioral disturbance: Secondary | ICD-10-CM | POA: Diagnosis not present

## 2018-09-14 DIAGNOSIS — L03114 Cellulitis of left upper limb: Secondary | ICD-10-CM | POA: Diagnosis not present

## 2018-09-14 DIAGNOSIS — E039 Hypothyroidism, unspecified: Secondary | ICD-10-CM | POA: Diagnosis not present

## 2018-09-14 DIAGNOSIS — I110 Hypertensive heart disease with heart failure: Secondary | ICD-10-CM | POA: Diagnosis not present

## 2018-09-14 DIAGNOSIS — E1142 Type 2 diabetes mellitus with diabetic polyneuropathy: Secondary | ICD-10-CM | POA: Diagnosis not present

## 2018-09-14 DIAGNOSIS — I5032 Chronic diastolic (congestive) heart failure: Secondary | ICD-10-CM | POA: Diagnosis not present

## 2018-09-15 DIAGNOSIS — I10 Essential (primary) hypertension: Secondary | ICD-10-CM | POA: Diagnosis not present

## 2018-09-15 DIAGNOSIS — I639 Cerebral infarction, unspecified: Secondary | ICD-10-CM | POA: Diagnosis not present

## 2018-09-15 DIAGNOSIS — R2681 Unsteadiness on feet: Secondary | ICD-10-CM | POA: Diagnosis not present

## 2018-09-15 DIAGNOSIS — E0865 Diabetes mellitus due to underlying condition with hyperglycemia: Secondary | ICD-10-CM | POA: Diagnosis not present

## 2018-09-15 DIAGNOSIS — E039 Hypothyroidism, unspecified: Secondary | ICD-10-CM | POA: Diagnosis not present

## 2018-09-15 DIAGNOSIS — Z6832 Body mass index (BMI) 32.0-32.9, adult: Secondary | ICD-10-CM | POA: Diagnosis not present

## 2018-09-15 DIAGNOSIS — I7101 Dissection of thoracic aorta: Secondary | ICD-10-CM | POA: Diagnosis not present

## 2018-09-15 DIAGNOSIS — L719 Rosacea, unspecified: Secondary | ICD-10-CM | POA: Diagnosis not present

## 2018-09-16 DIAGNOSIS — E1142 Type 2 diabetes mellitus with diabetic polyneuropathy: Secondary | ICD-10-CM | POA: Diagnosis not present

## 2018-09-16 DIAGNOSIS — F0280 Dementia in other diseases classified elsewhere without behavioral disturbance: Secondary | ICD-10-CM | POA: Diagnosis not present

## 2018-09-16 DIAGNOSIS — E039 Hypothyroidism, unspecified: Secondary | ICD-10-CM | POA: Diagnosis not present

## 2018-09-16 DIAGNOSIS — I110 Hypertensive heart disease with heart failure: Secondary | ICD-10-CM | POA: Diagnosis not present

## 2018-09-16 DIAGNOSIS — L03114 Cellulitis of left upper limb: Secondary | ICD-10-CM | POA: Diagnosis not present

## 2018-09-16 DIAGNOSIS — I5032 Chronic diastolic (congestive) heart failure: Secondary | ICD-10-CM | POA: Diagnosis not present

## 2018-09-19 DIAGNOSIS — E039 Hypothyroidism, unspecified: Secondary | ICD-10-CM | POA: Diagnosis not present

## 2018-09-19 DIAGNOSIS — F0280 Dementia in other diseases classified elsewhere without behavioral disturbance: Secondary | ICD-10-CM | POA: Diagnosis not present

## 2018-09-19 DIAGNOSIS — E1142 Type 2 diabetes mellitus with diabetic polyneuropathy: Secondary | ICD-10-CM | POA: Diagnosis not present

## 2018-09-19 DIAGNOSIS — I110 Hypertensive heart disease with heart failure: Secondary | ICD-10-CM | POA: Diagnosis not present

## 2018-09-19 DIAGNOSIS — I5032 Chronic diastolic (congestive) heart failure: Secondary | ICD-10-CM | POA: Diagnosis not present

## 2018-09-19 DIAGNOSIS — L03114 Cellulitis of left upper limb: Secondary | ICD-10-CM | POA: Diagnosis not present

## 2018-09-21 DIAGNOSIS — E1142 Type 2 diabetes mellitus with diabetic polyneuropathy: Secondary | ICD-10-CM | POA: Diagnosis not present

## 2018-09-21 DIAGNOSIS — F0280 Dementia in other diseases classified elsewhere without behavioral disturbance: Secondary | ICD-10-CM | POA: Diagnosis not present

## 2018-09-21 DIAGNOSIS — E039 Hypothyroidism, unspecified: Secondary | ICD-10-CM | POA: Diagnosis not present

## 2018-09-21 DIAGNOSIS — I110 Hypertensive heart disease with heart failure: Secondary | ICD-10-CM | POA: Diagnosis not present

## 2018-09-21 DIAGNOSIS — I5032 Chronic diastolic (congestive) heart failure: Secondary | ICD-10-CM | POA: Diagnosis not present

## 2018-09-21 DIAGNOSIS — L03114 Cellulitis of left upper limb: Secondary | ICD-10-CM | POA: Diagnosis not present

## 2018-09-26 DIAGNOSIS — F0280 Dementia in other diseases classified elsewhere without behavioral disturbance: Secondary | ICD-10-CM | POA: Diagnosis not present

## 2018-09-26 DIAGNOSIS — I110 Hypertensive heart disease with heart failure: Secondary | ICD-10-CM | POA: Diagnosis not present

## 2018-09-26 DIAGNOSIS — L03114 Cellulitis of left upper limb: Secondary | ICD-10-CM | POA: Diagnosis not present

## 2018-09-26 DIAGNOSIS — I5032 Chronic diastolic (congestive) heart failure: Secondary | ICD-10-CM | POA: Diagnosis not present

## 2018-09-26 DIAGNOSIS — E039 Hypothyroidism, unspecified: Secondary | ICD-10-CM | POA: Diagnosis not present

## 2018-09-26 DIAGNOSIS — E1142 Type 2 diabetes mellitus with diabetic polyneuropathy: Secondary | ICD-10-CM | POA: Diagnosis not present

## 2018-09-28 DIAGNOSIS — E039 Hypothyroidism, unspecified: Secondary | ICD-10-CM | POA: Diagnosis not present

## 2018-09-28 DIAGNOSIS — F0280 Dementia in other diseases classified elsewhere without behavioral disturbance: Secondary | ICD-10-CM | POA: Diagnosis not present

## 2018-09-28 DIAGNOSIS — I110 Hypertensive heart disease with heart failure: Secondary | ICD-10-CM | POA: Diagnosis not present

## 2018-09-28 DIAGNOSIS — I5032 Chronic diastolic (congestive) heart failure: Secondary | ICD-10-CM | POA: Diagnosis not present

## 2018-09-28 DIAGNOSIS — E1142 Type 2 diabetes mellitus with diabetic polyneuropathy: Secondary | ICD-10-CM | POA: Diagnosis not present

## 2018-09-28 DIAGNOSIS — L03114 Cellulitis of left upper limb: Secondary | ICD-10-CM | POA: Diagnosis not present

## 2018-09-29 DIAGNOSIS — L03114 Cellulitis of left upper limb: Secondary | ICD-10-CM | POA: Diagnosis not present

## 2018-09-29 DIAGNOSIS — E1142 Type 2 diabetes mellitus with diabetic polyneuropathy: Secondary | ICD-10-CM | POA: Diagnosis not present

## 2018-09-29 DIAGNOSIS — I110 Hypertensive heart disease with heart failure: Secondary | ICD-10-CM | POA: Diagnosis not present

## 2018-09-29 DIAGNOSIS — F0280 Dementia in other diseases classified elsewhere without behavioral disturbance: Secondary | ICD-10-CM | POA: Diagnosis not present

## 2018-09-29 DIAGNOSIS — E039 Hypothyroidism, unspecified: Secondary | ICD-10-CM | POA: Diagnosis not present

## 2018-09-29 DIAGNOSIS — I5032 Chronic diastolic (congestive) heart failure: Secondary | ICD-10-CM | POA: Diagnosis not present

## 2018-10-03 DIAGNOSIS — F0280 Dementia in other diseases classified elsewhere without behavioral disturbance: Secondary | ICD-10-CM | POA: Diagnosis not present

## 2018-10-03 DIAGNOSIS — I110 Hypertensive heart disease with heart failure: Secondary | ICD-10-CM | POA: Diagnosis not present

## 2018-10-03 DIAGNOSIS — E785 Hyperlipidemia, unspecified: Secondary | ICD-10-CM | POA: Diagnosis not present

## 2018-10-03 DIAGNOSIS — E1142 Type 2 diabetes mellitus with diabetic polyneuropathy: Secondary | ICD-10-CM | POA: Diagnosis not present

## 2018-10-03 DIAGNOSIS — Z9181 History of falling: Secondary | ICD-10-CM | POA: Diagnosis not present

## 2018-10-03 DIAGNOSIS — Z8673 Personal history of transient ischemic attack (TIA), and cerebral infarction without residual deficits: Secondary | ICD-10-CM | POA: Diagnosis not present

## 2018-10-03 DIAGNOSIS — E039 Hypothyroidism, unspecified: Secondary | ICD-10-CM | POA: Diagnosis not present

## 2018-10-03 DIAGNOSIS — K219 Gastro-esophageal reflux disease without esophagitis: Secondary | ICD-10-CM | POA: Diagnosis not present

## 2018-10-03 DIAGNOSIS — I272 Pulmonary hypertension, unspecified: Secondary | ICD-10-CM | POA: Diagnosis not present

## 2018-10-03 DIAGNOSIS — Z86711 Personal history of pulmonary embolism: Secondary | ICD-10-CM | POA: Diagnosis not present

## 2018-10-03 DIAGNOSIS — Z7902 Long term (current) use of antithrombotics/antiplatelets: Secondary | ICD-10-CM | POA: Diagnosis not present

## 2018-10-03 DIAGNOSIS — I5032 Chronic diastolic (congestive) heart failure: Secondary | ICD-10-CM | POA: Diagnosis not present

## 2018-10-03 DIAGNOSIS — Z7984 Long term (current) use of oral hypoglycemic drugs: Secondary | ICD-10-CM | POA: Diagnosis not present

## 2018-10-03 DIAGNOSIS — G8191 Hemiplegia, unspecified affecting right dominant side: Secondary | ICD-10-CM | POA: Diagnosis not present

## 2018-10-03 DIAGNOSIS — L03114 Cellulitis of left upper limb: Secondary | ICD-10-CM | POA: Diagnosis not present

## 2018-10-04 DIAGNOSIS — F0280 Dementia in other diseases classified elsewhere without behavioral disturbance: Secondary | ICD-10-CM | POA: Diagnosis not present

## 2018-10-04 DIAGNOSIS — E039 Hypothyroidism, unspecified: Secondary | ICD-10-CM | POA: Diagnosis not present

## 2018-10-04 DIAGNOSIS — I5032 Chronic diastolic (congestive) heart failure: Secondary | ICD-10-CM | POA: Diagnosis not present

## 2018-10-04 DIAGNOSIS — L03114 Cellulitis of left upper limb: Secondary | ICD-10-CM | POA: Diagnosis not present

## 2018-10-04 DIAGNOSIS — I110 Hypertensive heart disease with heart failure: Secondary | ICD-10-CM | POA: Diagnosis not present

## 2018-10-04 DIAGNOSIS — E1142 Type 2 diabetes mellitus with diabetic polyneuropathy: Secondary | ICD-10-CM | POA: Diagnosis not present

## 2018-10-05 DIAGNOSIS — I110 Hypertensive heart disease with heart failure: Secondary | ICD-10-CM | POA: Diagnosis not present

## 2018-10-05 DIAGNOSIS — F0280 Dementia in other diseases classified elsewhere without behavioral disturbance: Secondary | ICD-10-CM | POA: Diagnosis not present

## 2018-10-05 DIAGNOSIS — I5032 Chronic diastolic (congestive) heart failure: Secondary | ICD-10-CM | POA: Diagnosis not present

## 2018-10-05 DIAGNOSIS — E039 Hypothyroidism, unspecified: Secondary | ICD-10-CM | POA: Diagnosis not present

## 2018-10-05 DIAGNOSIS — E1142 Type 2 diabetes mellitus with diabetic polyneuropathy: Secondary | ICD-10-CM | POA: Diagnosis not present

## 2018-10-05 DIAGNOSIS — L03114 Cellulitis of left upper limb: Secondary | ICD-10-CM | POA: Diagnosis not present

## 2018-10-07 DIAGNOSIS — E1142 Type 2 diabetes mellitus with diabetic polyneuropathy: Secondary | ICD-10-CM | POA: Diagnosis not present

## 2018-10-07 DIAGNOSIS — F0280 Dementia in other diseases classified elsewhere without behavioral disturbance: Secondary | ICD-10-CM | POA: Diagnosis not present

## 2018-10-07 DIAGNOSIS — E039 Hypothyroidism, unspecified: Secondary | ICD-10-CM | POA: Diagnosis not present

## 2018-10-07 DIAGNOSIS — L03114 Cellulitis of left upper limb: Secondary | ICD-10-CM | POA: Diagnosis not present

## 2018-10-07 DIAGNOSIS — I5032 Chronic diastolic (congestive) heart failure: Secondary | ICD-10-CM | POA: Diagnosis not present

## 2018-10-07 DIAGNOSIS — I110 Hypertensive heart disease with heart failure: Secondary | ICD-10-CM | POA: Diagnosis not present

## 2018-10-10 DIAGNOSIS — E1142 Type 2 diabetes mellitus with diabetic polyneuropathy: Secondary | ICD-10-CM | POA: Diagnosis not present

## 2018-10-10 DIAGNOSIS — I110 Hypertensive heart disease with heart failure: Secondary | ICD-10-CM | POA: Diagnosis not present

## 2018-10-10 DIAGNOSIS — E039 Hypothyroidism, unspecified: Secondary | ICD-10-CM | POA: Diagnosis not present

## 2018-10-10 DIAGNOSIS — L03114 Cellulitis of left upper limb: Secondary | ICD-10-CM | POA: Diagnosis not present

## 2018-10-10 DIAGNOSIS — F0280 Dementia in other diseases classified elsewhere without behavioral disturbance: Secondary | ICD-10-CM | POA: Diagnosis not present

## 2018-10-10 DIAGNOSIS — I5032 Chronic diastolic (congestive) heart failure: Secondary | ICD-10-CM | POA: Diagnosis not present

## 2018-10-12 DIAGNOSIS — L03114 Cellulitis of left upper limb: Secondary | ICD-10-CM | POA: Diagnosis not present

## 2018-10-12 DIAGNOSIS — I110 Hypertensive heart disease with heart failure: Secondary | ICD-10-CM | POA: Diagnosis not present

## 2018-10-12 DIAGNOSIS — E1142 Type 2 diabetes mellitus with diabetic polyneuropathy: Secondary | ICD-10-CM | POA: Diagnosis not present

## 2018-10-12 DIAGNOSIS — F0280 Dementia in other diseases classified elsewhere without behavioral disturbance: Secondary | ICD-10-CM | POA: Diagnosis not present

## 2018-10-12 DIAGNOSIS — E039 Hypothyroidism, unspecified: Secondary | ICD-10-CM | POA: Diagnosis not present

## 2018-10-12 DIAGNOSIS — I5032 Chronic diastolic (congestive) heart failure: Secondary | ICD-10-CM | POA: Diagnosis not present

## 2018-10-13 DIAGNOSIS — L719 Rosacea, unspecified: Secondary | ICD-10-CM | POA: Diagnosis not present

## 2018-10-13 DIAGNOSIS — I10 Essential (primary) hypertension: Secondary | ICD-10-CM | POA: Diagnosis not present

## 2018-10-13 DIAGNOSIS — Z6832 Body mass index (BMI) 32.0-32.9, adult: Secondary | ICD-10-CM | POA: Diagnosis not present

## 2018-10-13 DIAGNOSIS — K219 Gastro-esophageal reflux disease without esophagitis: Secondary | ICD-10-CM | POA: Diagnosis not present

## 2018-10-13 DIAGNOSIS — Z23 Encounter for immunization: Secondary | ICD-10-CM | POA: Diagnosis not present

## 2018-10-13 DIAGNOSIS — I639 Cerebral infarction, unspecified: Secondary | ICD-10-CM | POA: Diagnosis not present

## 2018-10-13 DIAGNOSIS — E0865 Diabetes mellitus due to underlying condition with hyperglycemia: Secondary | ICD-10-CM | POA: Diagnosis not present

## 2018-10-13 DIAGNOSIS — I7101 Dissection of thoracic aorta: Secondary | ICD-10-CM | POA: Diagnosis not present

## 2018-10-17 DIAGNOSIS — F0280 Dementia in other diseases classified elsewhere without behavioral disturbance: Secondary | ICD-10-CM | POA: Diagnosis not present

## 2018-10-17 DIAGNOSIS — I5032 Chronic diastolic (congestive) heart failure: Secondary | ICD-10-CM | POA: Diagnosis not present

## 2018-10-17 DIAGNOSIS — E1142 Type 2 diabetes mellitus with diabetic polyneuropathy: Secondary | ICD-10-CM | POA: Diagnosis not present

## 2018-10-17 DIAGNOSIS — I110 Hypertensive heart disease with heart failure: Secondary | ICD-10-CM | POA: Diagnosis not present

## 2018-10-17 DIAGNOSIS — L03114 Cellulitis of left upper limb: Secondary | ICD-10-CM | POA: Diagnosis not present

## 2018-10-17 DIAGNOSIS — E039 Hypothyroidism, unspecified: Secondary | ICD-10-CM | POA: Diagnosis not present

## 2018-10-19 DIAGNOSIS — F0280 Dementia in other diseases classified elsewhere without behavioral disturbance: Secondary | ICD-10-CM | POA: Diagnosis not present

## 2018-10-19 DIAGNOSIS — E1142 Type 2 diabetes mellitus with diabetic polyneuropathy: Secondary | ICD-10-CM | POA: Diagnosis not present

## 2018-10-19 DIAGNOSIS — L03114 Cellulitis of left upper limb: Secondary | ICD-10-CM | POA: Diagnosis not present

## 2018-10-19 DIAGNOSIS — E039 Hypothyroidism, unspecified: Secondary | ICD-10-CM | POA: Diagnosis not present

## 2018-10-19 DIAGNOSIS — I110 Hypertensive heart disease with heart failure: Secondary | ICD-10-CM | POA: Diagnosis not present

## 2018-10-19 DIAGNOSIS — I5032 Chronic diastolic (congestive) heart failure: Secondary | ICD-10-CM | POA: Diagnosis not present

## 2018-10-21 DIAGNOSIS — F0280 Dementia in other diseases classified elsewhere without behavioral disturbance: Secondary | ICD-10-CM | POA: Diagnosis not present

## 2018-10-21 DIAGNOSIS — L03114 Cellulitis of left upper limb: Secondary | ICD-10-CM | POA: Diagnosis not present

## 2018-10-21 DIAGNOSIS — E1142 Type 2 diabetes mellitus with diabetic polyneuropathy: Secondary | ICD-10-CM | POA: Diagnosis not present

## 2018-10-21 DIAGNOSIS — I110 Hypertensive heart disease with heart failure: Secondary | ICD-10-CM | POA: Diagnosis not present

## 2018-10-21 DIAGNOSIS — E039 Hypothyroidism, unspecified: Secondary | ICD-10-CM | POA: Diagnosis not present

## 2018-10-21 DIAGNOSIS — I5032 Chronic diastolic (congestive) heart failure: Secondary | ICD-10-CM | POA: Diagnosis not present

## 2018-10-24 DIAGNOSIS — L03114 Cellulitis of left upper limb: Secondary | ICD-10-CM | POA: Diagnosis not present

## 2018-10-24 DIAGNOSIS — F0280 Dementia in other diseases classified elsewhere without behavioral disturbance: Secondary | ICD-10-CM | POA: Diagnosis not present

## 2018-10-24 DIAGNOSIS — I5032 Chronic diastolic (congestive) heart failure: Secondary | ICD-10-CM | POA: Diagnosis not present

## 2018-10-24 DIAGNOSIS — I110 Hypertensive heart disease with heart failure: Secondary | ICD-10-CM | POA: Diagnosis not present

## 2018-10-24 DIAGNOSIS — E1142 Type 2 diabetes mellitus with diabetic polyneuropathy: Secondary | ICD-10-CM | POA: Diagnosis not present

## 2018-10-24 DIAGNOSIS — E039 Hypothyroidism, unspecified: Secondary | ICD-10-CM | POA: Diagnosis not present

## 2018-10-25 DIAGNOSIS — E039 Hypothyroidism, unspecified: Secondary | ICD-10-CM | POA: Diagnosis not present

## 2018-10-25 DIAGNOSIS — I5032 Chronic diastolic (congestive) heart failure: Secondary | ICD-10-CM | POA: Diagnosis not present

## 2018-10-25 DIAGNOSIS — E1142 Type 2 diabetes mellitus with diabetic polyneuropathy: Secondary | ICD-10-CM | POA: Diagnosis not present

## 2018-10-25 DIAGNOSIS — I110 Hypertensive heart disease with heart failure: Secondary | ICD-10-CM | POA: Diagnosis not present

## 2018-10-25 DIAGNOSIS — F0280 Dementia in other diseases classified elsewhere without behavioral disturbance: Secondary | ICD-10-CM | POA: Diagnosis not present

## 2018-10-25 DIAGNOSIS — L03114 Cellulitis of left upper limb: Secondary | ICD-10-CM | POA: Diagnosis not present

## 2018-10-26 DIAGNOSIS — I110 Hypertensive heart disease with heart failure: Secondary | ICD-10-CM | POA: Diagnosis not present

## 2018-10-26 DIAGNOSIS — I5032 Chronic diastolic (congestive) heart failure: Secondary | ICD-10-CM | POA: Diagnosis not present

## 2018-10-26 DIAGNOSIS — F0280 Dementia in other diseases classified elsewhere without behavioral disturbance: Secondary | ICD-10-CM | POA: Diagnosis not present

## 2018-10-26 DIAGNOSIS — L03114 Cellulitis of left upper limb: Secondary | ICD-10-CM | POA: Diagnosis not present

## 2018-10-26 DIAGNOSIS — E1142 Type 2 diabetes mellitus with diabetic polyneuropathy: Secondary | ICD-10-CM | POA: Diagnosis not present

## 2018-10-26 DIAGNOSIS — E039 Hypothyroidism, unspecified: Secondary | ICD-10-CM | POA: Diagnosis not present

## 2018-10-31 DIAGNOSIS — F0280 Dementia in other diseases classified elsewhere without behavioral disturbance: Secondary | ICD-10-CM | POA: Diagnosis not present

## 2018-10-31 DIAGNOSIS — E039 Hypothyroidism, unspecified: Secondary | ICD-10-CM | POA: Diagnosis not present

## 2018-10-31 DIAGNOSIS — E1142 Type 2 diabetes mellitus with diabetic polyneuropathy: Secondary | ICD-10-CM | POA: Diagnosis not present

## 2018-10-31 DIAGNOSIS — I5032 Chronic diastolic (congestive) heart failure: Secondary | ICD-10-CM | POA: Diagnosis not present

## 2018-10-31 DIAGNOSIS — I110 Hypertensive heart disease with heart failure: Secondary | ICD-10-CM | POA: Diagnosis not present

## 2018-10-31 DIAGNOSIS — L03114 Cellulitis of left upper limb: Secondary | ICD-10-CM | POA: Diagnosis not present

## 2018-11-02 DIAGNOSIS — E039 Hypothyroidism, unspecified: Secondary | ICD-10-CM | POA: Diagnosis not present

## 2018-11-02 DIAGNOSIS — K219 Gastro-esophageal reflux disease without esophagitis: Secondary | ICD-10-CM | POA: Diagnosis not present

## 2018-11-02 DIAGNOSIS — Z86711 Personal history of pulmonary embolism: Secondary | ICD-10-CM | POA: Diagnosis not present

## 2018-11-02 DIAGNOSIS — Z9181 History of falling: Secondary | ICD-10-CM | POA: Diagnosis not present

## 2018-11-02 DIAGNOSIS — F0280 Dementia in other diseases classified elsewhere without behavioral disturbance: Secondary | ICD-10-CM | POA: Diagnosis not present

## 2018-11-02 DIAGNOSIS — Z8673 Personal history of transient ischemic attack (TIA), and cerebral infarction without residual deficits: Secondary | ICD-10-CM | POA: Diagnosis not present

## 2018-11-02 DIAGNOSIS — I5032 Chronic diastolic (congestive) heart failure: Secondary | ICD-10-CM | POA: Diagnosis not present

## 2018-11-02 DIAGNOSIS — E1142 Type 2 diabetes mellitus with diabetic polyneuropathy: Secondary | ICD-10-CM | POA: Diagnosis not present

## 2018-11-02 DIAGNOSIS — E785 Hyperlipidemia, unspecified: Secondary | ICD-10-CM | POA: Diagnosis not present

## 2018-11-02 DIAGNOSIS — L03114 Cellulitis of left upper limb: Secondary | ICD-10-CM | POA: Diagnosis not present

## 2018-11-02 DIAGNOSIS — Z7984 Long term (current) use of oral hypoglycemic drugs: Secondary | ICD-10-CM | POA: Diagnosis not present

## 2018-11-02 DIAGNOSIS — I272 Pulmonary hypertension, unspecified: Secondary | ICD-10-CM | POA: Diagnosis not present

## 2018-11-02 DIAGNOSIS — I110 Hypertensive heart disease with heart failure: Secondary | ICD-10-CM | POA: Diagnosis not present

## 2018-11-02 DIAGNOSIS — G8191 Hemiplegia, unspecified affecting right dominant side: Secondary | ICD-10-CM | POA: Diagnosis not present

## 2018-11-02 DIAGNOSIS — Z7902 Long term (current) use of antithrombotics/antiplatelets: Secondary | ICD-10-CM | POA: Diagnosis not present

## 2018-11-09 DIAGNOSIS — I110 Hypertensive heart disease with heart failure: Secondary | ICD-10-CM | POA: Diagnosis not present

## 2018-11-09 DIAGNOSIS — L03114 Cellulitis of left upper limb: Secondary | ICD-10-CM | POA: Diagnosis not present

## 2018-11-09 DIAGNOSIS — F0280 Dementia in other diseases classified elsewhere without behavioral disturbance: Secondary | ICD-10-CM | POA: Diagnosis not present

## 2018-11-09 DIAGNOSIS — E039 Hypothyroidism, unspecified: Secondary | ICD-10-CM | POA: Diagnosis not present

## 2018-11-09 DIAGNOSIS — I5032 Chronic diastolic (congestive) heart failure: Secondary | ICD-10-CM | POA: Diagnosis not present

## 2018-11-09 DIAGNOSIS — E1142 Type 2 diabetes mellitus with diabetic polyneuropathy: Secondary | ICD-10-CM | POA: Diagnosis not present

## 2018-11-25 DIAGNOSIS — K219 Gastro-esophageal reflux disease without esophagitis: Secondary | ICD-10-CM | POA: Diagnosis not present

## 2018-11-25 DIAGNOSIS — I10 Essential (primary) hypertension: Secondary | ICD-10-CM | POA: Diagnosis not present

## 2018-12-03 DIAGNOSIS — G9341 Metabolic encephalopathy: Secondary | ICD-10-CM | POA: Diagnosis not present

## 2018-12-03 DIAGNOSIS — I5032 Chronic diastolic (congestive) heart failure: Secondary | ICD-10-CM | POA: Diagnosis not present

## 2018-12-03 DIAGNOSIS — R4182 Altered mental status, unspecified: Secondary | ICD-10-CM | POA: Diagnosis not present

## 2018-12-03 DIAGNOSIS — R262 Difficulty in walking, not elsewhere classified: Secondary | ICD-10-CM | POA: Diagnosis not present

## 2018-12-03 DIAGNOSIS — G319 Degenerative disease of nervous system, unspecified: Secondary | ICD-10-CM | POA: Diagnosis not present

## 2018-12-03 DIAGNOSIS — Z1624 Resistance to multiple antibiotics: Secondary | ICD-10-CM | POA: Diagnosis not present

## 2018-12-03 DIAGNOSIS — R531 Weakness: Secondary | ICD-10-CM | POA: Diagnosis not present

## 2018-12-03 DIAGNOSIS — N390 Urinary tract infection, site not specified: Secondary | ICD-10-CM | POA: Diagnosis not present

## 2018-12-03 DIAGNOSIS — Z0289 Encounter for other administrative examinations: Secondary | ICD-10-CM | POA: Diagnosis not present

## 2018-12-03 DIAGNOSIS — Z1612 Extended spectrum beta lactamase (ESBL) resistance: Secondary | ICD-10-CM | POA: Diagnosis not present

## 2018-12-04 DIAGNOSIS — Z1624 Resistance to multiple antibiotics: Secondary | ICD-10-CM | POA: Diagnosis present

## 2018-12-04 DIAGNOSIS — I639 Cerebral infarction, unspecified: Secondary | ICD-10-CM | POA: Diagnosis not present

## 2018-12-04 DIAGNOSIS — I5032 Chronic diastolic (congestive) heart failure: Secondary | ICD-10-CM | POA: Diagnosis present

## 2018-12-04 DIAGNOSIS — G459 Transient cerebral ischemic attack, unspecified: Secondary | ICD-10-CM | POA: Diagnosis not present

## 2018-12-04 DIAGNOSIS — Z7984 Long term (current) use of oral hypoglycemic drugs: Secondary | ICD-10-CM | POA: Diagnosis not present

## 2018-12-04 DIAGNOSIS — G4089 Other seizures: Secondary | ICD-10-CM | POA: Diagnosis not present

## 2018-12-04 DIAGNOSIS — I2510 Atherosclerotic heart disease of native coronary artery without angina pectoris: Secondary | ICD-10-CM | POA: Diagnosis not present

## 2018-12-04 DIAGNOSIS — Z8673 Personal history of transient ischemic attack (TIA), and cerebral infarction without residual deficits: Secondary | ICD-10-CM | POA: Diagnosis not present

## 2018-12-04 DIAGNOSIS — G9341 Metabolic encephalopathy: Secondary | ICD-10-CM | POA: Diagnosis present

## 2018-12-04 DIAGNOSIS — I6930 Unspecified sequelae of cerebral infarction: Secondary | ICD-10-CM | POA: Diagnosis not present

## 2018-12-04 DIAGNOSIS — F0390 Unspecified dementia without behavioral disturbance: Secondary | ICD-10-CM | POA: Diagnosis present

## 2018-12-04 DIAGNOSIS — B9689 Other specified bacterial agents as the cause of diseases classified elsewhere: Secondary | ICD-10-CM | POA: Diagnosis present

## 2018-12-04 DIAGNOSIS — K219 Gastro-esophageal reflux disease without esophagitis: Secondary | ICD-10-CM | POA: Diagnosis present

## 2018-12-04 DIAGNOSIS — E1142 Type 2 diabetes mellitus with diabetic polyneuropathy: Secondary | ICD-10-CM | POA: Diagnosis present

## 2018-12-04 DIAGNOSIS — I10 Essential (primary) hypertension: Secondary | ICD-10-CM | POA: Diagnosis not present

## 2018-12-04 DIAGNOSIS — E1140 Type 2 diabetes mellitus with diabetic neuropathy, unspecified: Secondary | ICD-10-CM | POA: Diagnosis not present

## 2018-12-04 DIAGNOSIS — Z1612 Extended spectrum beta lactamase (ESBL) resistance: Secondary | ICD-10-CM | POA: Diagnosis present

## 2018-12-04 DIAGNOSIS — E039 Hypothyroidism, unspecified: Secondary | ICD-10-CM | POA: Diagnosis not present

## 2018-12-04 DIAGNOSIS — M6281 Muscle weakness (generalized): Secondary | ICD-10-CM | POA: Diagnosis not present

## 2018-12-04 DIAGNOSIS — R269 Unspecified abnormalities of gait and mobility: Secondary | ICD-10-CM | POA: Diagnosis not present

## 2018-12-04 DIAGNOSIS — Z91048 Other nonmedicinal substance allergy status: Secondary | ICD-10-CM | POA: Diagnosis not present

## 2018-12-04 DIAGNOSIS — R262 Difficulty in walking, not elsewhere classified: Secondary | ICD-10-CM | POA: Diagnosis not present

## 2018-12-04 DIAGNOSIS — Z882 Allergy status to sulfonamides status: Secondary | ICD-10-CM | POA: Diagnosis not present

## 2018-12-04 DIAGNOSIS — I5030 Unspecified diastolic (congestive) heart failure: Secondary | ICD-10-CM | POA: Diagnosis not present

## 2018-12-04 DIAGNOSIS — I110 Hypertensive heart disease with heart failure: Secondary | ICD-10-CM | POA: Diagnosis present

## 2018-12-04 DIAGNOSIS — N390 Urinary tract infection, site not specified: Secondary | ICD-10-CM | POA: Diagnosis not present

## 2018-12-04 DIAGNOSIS — I6389 Other cerebral infarction: Secondary | ICD-10-CM | POA: Diagnosis not present

## 2018-12-04 DIAGNOSIS — G309 Alzheimer's disease, unspecified: Secondary | ICD-10-CM | POA: Diagnosis not present

## 2018-12-04 DIAGNOSIS — Z79899 Other long term (current) drug therapy: Secondary | ICD-10-CM | POA: Diagnosis not present

## 2018-12-04 DIAGNOSIS — J189 Pneumonia, unspecified organism: Secondary | ICD-10-CM | POA: Diagnosis not present

## 2018-12-04 DIAGNOSIS — R41841 Cognitive communication deficit: Secondary | ICD-10-CM | POA: Diagnosis not present

## 2018-12-04 DIAGNOSIS — Z7902 Long term (current) use of antithrombotics/antiplatelets: Secondary | ICD-10-CM | POA: Diagnosis not present

## 2018-12-04 DIAGNOSIS — E785 Hyperlipidemia, unspecified: Secondary | ICD-10-CM | POA: Diagnosis present

## 2018-12-04 DIAGNOSIS — Z01810 Encounter for preprocedural cardiovascular examination: Secondary | ICD-10-CM | POA: Diagnosis not present

## 2018-12-04 DIAGNOSIS — Z20828 Contact with and (suspected) exposure to other viral communicable diseases: Secondary | ICD-10-CM | POA: Diagnosis present

## 2018-12-07 DIAGNOSIS — M109 Gout, unspecified: Secondary | ICD-10-CM | POA: Diagnosis not present

## 2018-12-07 DIAGNOSIS — F0390 Unspecified dementia without behavioral disturbance: Secondary | ICD-10-CM | POA: Diagnosis not present

## 2018-12-07 DIAGNOSIS — I5030 Unspecified diastolic (congestive) heart failure: Secondary | ICD-10-CM | POA: Diagnosis not present

## 2018-12-07 DIAGNOSIS — Z20828 Contact with and (suspected) exposure to other viral communicable diseases: Secondary | ICD-10-CM | POA: Diagnosis not present

## 2018-12-07 DIAGNOSIS — K219 Gastro-esophageal reflux disease without esophagitis: Secondary | ICD-10-CM | POA: Diagnosis not present

## 2018-12-07 DIAGNOSIS — I10 Essential (primary) hypertension: Secondary | ICD-10-CM | POA: Diagnosis not present

## 2018-12-07 DIAGNOSIS — Z8673 Personal history of transient ischemic attack (TIA), and cerebral infarction without residual deficits: Secondary | ICD-10-CM | POA: Diagnosis not present

## 2018-12-07 DIAGNOSIS — E785 Hyperlipidemia, unspecified: Secondary | ICD-10-CM | POA: Diagnosis not present

## 2018-12-07 DIAGNOSIS — R262 Difficulty in walking, not elsewhere classified: Secondary | ICD-10-CM | POA: Diagnosis not present

## 2018-12-07 DIAGNOSIS — M6281 Muscle weakness (generalized): Secondary | ICD-10-CM | POA: Diagnosis not present

## 2018-12-07 DIAGNOSIS — E1140 Type 2 diabetes mellitus with diabetic neuropathy, unspecified: Secondary | ICD-10-CM | POA: Diagnosis not present

## 2018-12-07 DIAGNOSIS — E119 Type 2 diabetes mellitus without complications: Secondary | ICD-10-CM | POA: Diagnosis not present

## 2018-12-07 DIAGNOSIS — R300 Dysuria: Secondary | ICD-10-CM | POA: Diagnosis not present

## 2018-12-07 DIAGNOSIS — Z1612 Extended spectrum beta lactamase (ESBL) resistance: Secondary | ICD-10-CM | POA: Diagnosis not present

## 2018-12-07 DIAGNOSIS — G459 Transient cerebral ischemic attack, unspecified: Secondary | ICD-10-CM | POA: Diagnosis not present

## 2018-12-07 DIAGNOSIS — R269 Unspecified abnormalities of gait and mobility: Secondary | ICD-10-CM | POA: Diagnosis not present

## 2018-12-07 DIAGNOSIS — E039 Hypothyroidism, unspecified: Secondary | ICD-10-CM | POA: Diagnosis not present

## 2018-12-07 DIAGNOSIS — L0390 Cellulitis, unspecified: Secondary | ICD-10-CM | POA: Diagnosis not present

## 2018-12-07 DIAGNOSIS — I5032 Chronic diastolic (congestive) heart failure: Secondary | ICD-10-CM | POA: Diagnosis not present

## 2018-12-07 DIAGNOSIS — N390 Urinary tract infection, site not specified: Secondary | ICD-10-CM | POA: Diagnosis not present

## 2018-12-07 DIAGNOSIS — G9341 Metabolic encephalopathy: Secondary | ICD-10-CM | POA: Diagnosis not present

## 2018-12-07 DIAGNOSIS — R41841 Cognitive communication deficit: Secondary | ICD-10-CM | POA: Diagnosis not present

## 2018-12-08 DIAGNOSIS — I5030 Unspecified diastolic (congestive) heart failure: Secondary | ICD-10-CM | POA: Diagnosis not present

## 2018-12-08 DIAGNOSIS — I10 Essential (primary) hypertension: Secondary | ICD-10-CM | POA: Diagnosis not present

## 2018-12-08 DIAGNOSIS — L0390 Cellulitis, unspecified: Secondary | ICD-10-CM | POA: Diagnosis not present

## 2018-12-08 DIAGNOSIS — G459 Transient cerebral ischemic attack, unspecified: Secondary | ICD-10-CM | POA: Diagnosis not present

## 2018-12-09 DIAGNOSIS — E119 Type 2 diabetes mellitus without complications: Secondary | ICD-10-CM | POA: Diagnosis not present

## 2018-12-09 DIAGNOSIS — F0390 Unspecified dementia without behavioral disturbance: Secondary | ICD-10-CM | POA: Diagnosis not present

## 2018-12-09 DIAGNOSIS — I10 Essential (primary) hypertension: Secondary | ICD-10-CM | POA: Diagnosis not present

## 2018-12-09 DIAGNOSIS — I5030 Unspecified diastolic (congestive) heart failure: Secondary | ICD-10-CM | POA: Diagnosis not present

## 2018-12-16 DIAGNOSIS — F0390 Unspecified dementia without behavioral disturbance: Secondary | ICD-10-CM | POA: Diagnosis not present

## 2018-12-16 DIAGNOSIS — R300 Dysuria: Secondary | ICD-10-CM | POA: Diagnosis not present

## 2018-12-21 DIAGNOSIS — M109 Gout, unspecified: Secondary | ICD-10-CM | POA: Diagnosis not present

## 2018-12-26 DIAGNOSIS — M109 Gout, unspecified: Secondary | ICD-10-CM | POA: Diagnosis not present

## 2019-01-02 DIAGNOSIS — Z20828 Contact with and (suspected) exposure to other viral communicable diseases: Secondary | ICD-10-CM | POA: Diagnosis not present

## 2019-01-03 DIAGNOSIS — I10 Essential (primary) hypertension: Secondary | ICD-10-CM | POA: Diagnosis not present

## 2019-01-03 DIAGNOSIS — E039 Hypothyroidism, unspecified: Secondary | ICD-10-CM | POA: Diagnosis present

## 2019-01-03 DIAGNOSIS — F0390 Unspecified dementia without behavioral disturbance: Secondary | ICD-10-CM | POA: Diagnosis not present

## 2019-01-03 DIAGNOSIS — I63439 Cerebral infarction due to embolism of unspecified posterior cerebral artery: Secondary | ICD-10-CM | POA: Diagnosis not present

## 2019-01-03 DIAGNOSIS — Z882 Allergy status to sulfonamides status: Secondary | ICD-10-CM | POA: Diagnosis not present

## 2019-01-03 DIAGNOSIS — Z20828 Contact with and (suspected) exposure to other viral communicable diseases: Secondary | ICD-10-CM | POA: Diagnosis present

## 2019-01-03 DIAGNOSIS — Z91048 Other nonmedicinal substance allergy status: Secondary | ICD-10-CM | POA: Diagnosis not present

## 2019-01-03 DIAGNOSIS — K219 Gastro-esophageal reflux disease without esophagitis: Secondary | ICD-10-CM | POA: Diagnosis present

## 2019-01-03 DIAGNOSIS — R569 Unspecified convulsions: Secondary | ICD-10-CM | POA: Diagnosis present

## 2019-01-03 DIAGNOSIS — F0280 Dementia in other diseases classified elsewhere without behavioral disturbance: Secondary | ICD-10-CM | POA: Diagnosis present

## 2019-01-03 DIAGNOSIS — I5030 Unspecified diastolic (congestive) heart failure: Secondary | ICD-10-CM | POA: Diagnosis not present

## 2019-01-03 DIAGNOSIS — I5032 Chronic diastolic (congestive) heart failure: Secondary | ICD-10-CM | POA: Diagnosis present

## 2019-01-03 DIAGNOSIS — G309 Alzheimer's disease, unspecified: Secondary | ICD-10-CM | POA: Diagnosis not present

## 2019-01-03 DIAGNOSIS — J159 Unspecified bacterial pneumonia: Secondary | ICD-10-CM | POA: Diagnosis present

## 2019-01-03 DIAGNOSIS — Z7902 Long term (current) use of antithrombotics/antiplatelets: Secondary | ICD-10-CM | POA: Diagnosis not present

## 2019-01-03 DIAGNOSIS — E785 Hyperlipidemia, unspecified: Secondary | ICD-10-CM | POA: Diagnosis present

## 2019-01-03 DIAGNOSIS — Z87440 Personal history of urinary (tract) infections: Secondary | ICD-10-CM | POA: Diagnosis not present

## 2019-01-03 DIAGNOSIS — E1142 Type 2 diabetes mellitus with diabetic polyneuropathy: Secondary | ICD-10-CM | POA: Diagnosis present

## 2019-01-03 DIAGNOSIS — Z881 Allergy status to other antibiotic agents status: Secondary | ICD-10-CM | POA: Diagnosis not present

## 2019-01-03 DIAGNOSIS — G9341 Metabolic encephalopathy: Secondary | ICD-10-CM | POA: Diagnosis not present

## 2019-01-03 DIAGNOSIS — I110 Hypertensive heart disease with heart failure: Secondary | ICD-10-CM | POA: Diagnosis present

## 2019-01-03 DIAGNOSIS — I639 Cerebral infarction, unspecified: Secondary | ICD-10-CM | POA: Diagnosis present

## 2019-01-03 DIAGNOSIS — Z7984 Long term (current) use of oral hypoglycemic drugs: Secondary | ICD-10-CM | POA: Diagnosis not present

## 2019-01-03 DIAGNOSIS — Z79899 Other long term (current) drug therapy: Secondary | ICD-10-CM | POA: Diagnosis not present

## 2019-01-03 DIAGNOSIS — Z8673 Personal history of transient ischemic attack (TIA), and cerebral infarction without residual deficits: Secondary | ICD-10-CM | POA: Diagnosis not present

## 2019-01-03 DIAGNOSIS — G459 Transient cerebral ischemic attack, unspecified: Secondary | ICD-10-CM | POA: Diagnosis not present

## 2019-01-25 DIAGNOSIS — G459 Transient cerebral ischemic attack, unspecified: Secondary | ICD-10-CM | POA: Diagnosis not present

## 2019-01-25 DIAGNOSIS — F0390 Unspecified dementia without behavioral disturbance: Secondary | ICD-10-CM | POA: Diagnosis not present

## 2019-01-25 DIAGNOSIS — L0390 Cellulitis, unspecified: Secondary | ICD-10-CM | POA: Diagnosis not present

## 2019-01-25 DIAGNOSIS — I5030 Unspecified diastolic (congestive) heart failure: Secondary | ICD-10-CM | POA: Diagnosis not present

## 2019-01-26 DIAGNOSIS — E0865 Diabetes mellitus due to underlying condition with hyperglycemia: Secondary | ICD-10-CM | POA: Diagnosis not present

## 2019-01-26 DIAGNOSIS — I10 Essential (primary) hypertension: Secondary | ICD-10-CM | POA: Diagnosis not present

## 2019-01-27 DIAGNOSIS — F0390 Unspecified dementia without behavioral disturbance: Secondary | ICD-10-CM | POA: Diagnosis not present

## 2019-01-27 DIAGNOSIS — G309 Alzheimer's disease, unspecified: Secondary | ICD-10-CM | POA: Diagnosis not present

## 2019-01-27 DIAGNOSIS — E039 Hypothyroidism, unspecified: Secondary | ICD-10-CM | POA: Diagnosis not present

## 2019-01-27 DIAGNOSIS — N390 Urinary tract infection, site not specified: Secondary | ICD-10-CM | POA: Diagnosis not present

## 2019-01-27 DIAGNOSIS — R269 Unspecified abnormalities of gait and mobility: Secondary | ICD-10-CM | POA: Diagnosis not present

## 2019-01-27 DIAGNOSIS — I10 Essential (primary) hypertension: Secondary | ICD-10-CM | POA: Diagnosis not present

## 2019-01-27 DIAGNOSIS — E785 Hyperlipidemia, unspecified: Secondary | ICD-10-CM | POA: Diagnosis not present

## 2019-01-27 DIAGNOSIS — R262 Difficulty in walking, not elsewhere classified: Secondary | ICD-10-CM | POA: Diagnosis not present

## 2019-01-27 DIAGNOSIS — Z23 Encounter for immunization: Secondary | ICD-10-CM | POA: Diagnosis not present

## 2019-01-27 DIAGNOSIS — I2510 Atherosclerotic heart disease of native coronary artery without angina pectoris: Secondary | ICD-10-CM | POA: Diagnosis not present

## 2019-01-27 DIAGNOSIS — M6281 Muscle weakness (generalized): Secondary | ICD-10-CM | POA: Diagnosis not present

## 2019-01-27 DIAGNOSIS — I6930 Unspecified sequelae of cerebral infarction: Secondary | ICD-10-CM | POA: Diagnosis not present

## 2019-01-27 DIAGNOSIS — E119 Type 2 diabetes mellitus without complications: Secondary | ICD-10-CM | POA: Diagnosis not present

## 2019-01-27 DIAGNOSIS — K219 Gastro-esophageal reflux disease without esophagitis: Secondary | ICD-10-CM | POA: Diagnosis not present

## 2019-01-27 DIAGNOSIS — J189 Pneumonia, unspecified organism: Secondary | ICD-10-CM | POA: Diagnosis not present

## 2019-01-27 DIAGNOSIS — E1140 Type 2 diabetes mellitus with diabetic neuropathy, unspecified: Secondary | ICD-10-CM | POA: Diagnosis not present

## 2019-01-27 DIAGNOSIS — G4089 Other seizures: Secondary | ICD-10-CM | POA: Diagnosis not present

## 2019-01-27 DIAGNOSIS — I5030 Unspecified diastolic (congestive) heart failure: Secondary | ICD-10-CM | POA: Diagnosis not present

## 2019-01-27 DIAGNOSIS — R41841 Cognitive communication deficit: Secondary | ICD-10-CM | POA: Diagnosis not present

## 2019-01-27 DIAGNOSIS — I5032 Chronic diastolic (congestive) heart failure: Secondary | ICD-10-CM | POA: Diagnosis not present

## 2019-01-27 DIAGNOSIS — Z20828 Contact with and (suspected) exposure to other viral communicable diseases: Secondary | ICD-10-CM | POA: Diagnosis not present

## 2019-01-30 DIAGNOSIS — E119 Type 2 diabetes mellitus without complications: Secondary | ICD-10-CM | POA: Diagnosis not present

## 2019-01-30 DIAGNOSIS — F0390 Unspecified dementia without behavioral disturbance: Secondary | ICD-10-CM | POA: Diagnosis not present

## 2019-01-30 DIAGNOSIS — I5030 Unspecified diastolic (congestive) heart failure: Secondary | ICD-10-CM | POA: Diagnosis not present

## 2019-01-30 DIAGNOSIS — I10 Essential (primary) hypertension: Secondary | ICD-10-CM | POA: Diagnosis not present

## 2019-02-08 DIAGNOSIS — I639 Cerebral infarction, unspecified: Secondary | ICD-10-CM | POA: Diagnosis not present

## 2019-02-08 DIAGNOSIS — E039 Hypothyroidism, unspecified: Secondary | ICD-10-CM | POA: Diagnosis not present

## 2019-02-08 DIAGNOSIS — M109 Gout, unspecified: Secondary | ICD-10-CM | POA: Diagnosis not present

## 2019-02-08 DIAGNOSIS — I10 Essential (primary) hypertension: Secondary | ICD-10-CM | POA: Diagnosis not present

## 2019-02-13 DIAGNOSIS — Z20828 Contact with and (suspected) exposure to other viral communicable diseases: Secondary | ICD-10-CM | POA: Diagnosis not present

## 2019-02-19 DIAGNOSIS — Z23 Encounter for immunization: Secondary | ICD-10-CM | POA: Diagnosis not present

## 2019-02-20 DIAGNOSIS — Z20828 Contact with and (suspected) exposure to other viral communicable diseases: Secondary | ICD-10-CM | POA: Diagnosis not present

## 2019-02-24 DIAGNOSIS — I10 Essential (primary) hypertension: Secondary | ICD-10-CM | POA: Diagnosis not present

## 2019-02-24 DIAGNOSIS — K219 Gastro-esophageal reflux disease without esophagitis: Secondary | ICD-10-CM | POA: Diagnosis not present

## 2019-02-27 DIAGNOSIS — Z20828 Contact with and (suspected) exposure to other viral communicable diseases: Secondary | ICD-10-CM | POA: Diagnosis not present

## 2019-03-06 DIAGNOSIS — Z20828 Contact with and (suspected) exposure to other viral communicable diseases: Secondary | ICD-10-CM | POA: Diagnosis not present

## 2019-03-13 DIAGNOSIS — M109 Gout, unspecified: Secondary | ICD-10-CM | POA: Diagnosis not present

## 2019-03-13 DIAGNOSIS — I639 Cerebral infarction, unspecified: Secondary | ICD-10-CM | POA: Diagnosis not present

## 2019-03-13 DIAGNOSIS — E039 Hypothyroidism, unspecified: Secondary | ICD-10-CM | POA: Diagnosis not present

## 2019-03-13 DIAGNOSIS — I10 Essential (primary) hypertension: Secondary | ICD-10-CM | POA: Diagnosis not present

## 2019-03-23 DIAGNOSIS — Z20828 Contact with and (suspected) exposure to other viral communicable diseases: Secondary | ICD-10-CM | POA: Diagnosis not present

## 2019-03-24 DIAGNOSIS — E039 Hypothyroidism, unspecified: Secondary | ICD-10-CM | POA: Diagnosis not present

## 2019-03-24 DIAGNOSIS — I10 Essential (primary) hypertension: Secondary | ICD-10-CM | POA: Diagnosis not present

## 2019-03-24 DIAGNOSIS — E0865 Diabetes mellitus due to underlying condition with hyperglycemia: Secondary | ICD-10-CM | POA: Diagnosis not present

## 2019-03-30 DIAGNOSIS — Z20828 Contact with and (suspected) exposure to other viral communicable diseases: Secondary | ICD-10-CM | POA: Diagnosis not present

## 2019-04-06 DIAGNOSIS — Z20828 Contact with and (suspected) exposure to other viral communicable diseases: Secondary | ICD-10-CM | POA: Diagnosis not present

## 2019-04-10 DIAGNOSIS — E1140 Type 2 diabetes mellitus with diabetic neuropathy, unspecified: Secondary | ICD-10-CM | POA: Diagnosis not present

## 2019-04-13 DIAGNOSIS — Z20828 Contact with and (suspected) exposure to other viral communicable diseases: Secondary | ICD-10-CM | POA: Diagnosis not present

## 2019-04-14 DIAGNOSIS — M6281 Muscle weakness (generalized): Secondary | ICD-10-CM | POA: Diagnosis not present

## 2019-04-14 DIAGNOSIS — R262 Difficulty in walking, not elsewhere classified: Secondary | ICD-10-CM | POA: Diagnosis not present

## 2019-04-17 DIAGNOSIS — M6281 Muscle weakness (generalized): Secondary | ICD-10-CM | POA: Diagnosis not present

## 2019-04-17 DIAGNOSIS — R262 Difficulty in walking, not elsewhere classified: Secondary | ICD-10-CM | POA: Diagnosis not present

## 2019-04-19 DIAGNOSIS — M6281 Muscle weakness (generalized): Secondary | ICD-10-CM | POA: Diagnosis not present

## 2019-04-19 DIAGNOSIS — R262 Difficulty in walking, not elsewhere classified: Secondary | ICD-10-CM | POA: Diagnosis not present

## 2019-04-21 DIAGNOSIS — M6281 Muscle weakness (generalized): Secondary | ICD-10-CM | POA: Diagnosis not present

## 2019-04-21 DIAGNOSIS — R262 Difficulty in walking, not elsewhere classified: Secondary | ICD-10-CM | POA: Diagnosis not present

## 2019-04-24 DIAGNOSIS — R262 Difficulty in walking, not elsewhere classified: Secondary | ICD-10-CM | POA: Diagnosis not present

## 2019-04-24 DIAGNOSIS — M6281 Muscle weakness (generalized): Secondary | ICD-10-CM | POA: Diagnosis not present

## 2019-04-26 DIAGNOSIS — M6281 Muscle weakness (generalized): Secondary | ICD-10-CM | POA: Diagnosis not present

## 2019-04-26 DIAGNOSIS — R262 Difficulty in walking, not elsewhere classified: Secondary | ICD-10-CM | POA: Diagnosis not present

## 2019-04-28 DIAGNOSIS — M6281 Muscle weakness (generalized): Secondary | ICD-10-CM | POA: Diagnosis not present

## 2019-04-28 DIAGNOSIS — R262 Difficulty in walking, not elsewhere classified: Secondary | ICD-10-CM | POA: Diagnosis not present

## 2019-05-02 DIAGNOSIS — M6281 Muscle weakness (generalized): Secondary | ICD-10-CM | POA: Diagnosis not present

## 2019-05-02 DIAGNOSIS — R262 Difficulty in walking, not elsewhere classified: Secondary | ICD-10-CM | POA: Diagnosis not present

## 2019-05-03 DIAGNOSIS — M6281 Muscle weakness (generalized): Secondary | ICD-10-CM | POA: Diagnosis not present

## 2019-05-03 DIAGNOSIS — R262 Difficulty in walking, not elsewhere classified: Secondary | ICD-10-CM | POA: Diagnosis not present

## 2019-05-05 DIAGNOSIS — M6281 Muscle weakness (generalized): Secondary | ICD-10-CM | POA: Diagnosis not present

## 2019-05-05 DIAGNOSIS — R262 Difficulty in walking, not elsewhere classified: Secondary | ICD-10-CM | POA: Diagnosis not present

## 2019-05-08 DIAGNOSIS — F0390 Unspecified dementia without behavioral disturbance: Secondary | ICD-10-CM | POA: Diagnosis not present

## 2019-05-08 DIAGNOSIS — G459 Transient cerebral ischemic attack, unspecified: Secondary | ICD-10-CM | POA: Diagnosis not present

## 2019-05-08 DIAGNOSIS — I10 Essential (primary) hypertension: Secondary | ICD-10-CM | POA: Diagnosis not present

## 2019-05-08 DIAGNOSIS — I5030 Unspecified diastolic (congestive) heart failure: Secondary | ICD-10-CM | POA: Diagnosis not present

## 2019-05-09 DIAGNOSIS — M6281 Muscle weakness (generalized): Secondary | ICD-10-CM | POA: Diagnosis not present

## 2019-05-09 DIAGNOSIS — R262 Difficulty in walking, not elsewhere classified: Secondary | ICD-10-CM | POA: Diagnosis not present

## 2019-05-10 DIAGNOSIS — R262 Difficulty in walking, not elsewhere classified: Secondary | ICD-10-CM | POA: Diagnosis not present

## 2019-05-10 DIAGNOSIS — M6281 Muscle weakness (generalized): Secondary | ICD-10-CM | POA: Diagnosis not present

## 2019-05-11 DIAGNOSIS — M6281 Muscle weakness (generalized): Secondary | ICD-10-CM | POA: Diagnosis not present

## 2019-05-11 DIAGNOSIS — R262 Difficulty in walking, not elsewhere classified: Secondary | ICD-10-CM | POA: Diagnosis not present

## 2019-05-29 DIAGNOSIS — L821 Other seborrheic keratosis: Secondary | ICD-10-CM | POA: Diagnosis not present

## 2019-05-29 DIAGNOSIS — D692 Other nonthrombocytopenic purpura: Secondary | ICD-10-CM | POA: Diagnosis not present

## 2019-05-29 DIAGNOSIS — L820 Inflamed seborrheic keratosis: Secondary | ICD-10-CM | POA: Diagnosis not present

## 2019-07-05 DIAGNOSIS — L0390 Cellulitis, unspecified: Secondary | ICD-10-CM | POA: Diagnosis not present

## 2019-07-05 DIAGNOSIS — F0390 Unspecified dementia without behavioral disturbance: Secondary | ICD-10-CM | POA: Diagnosis not present

## 2019-07-05 DIAGNOSIS — I5030 Unspecified diastolic (congestive) heart failure: Secondary | ICD-10-CM | POA: Diagnosis not present

## 2019-07-05 DIAGNOSIS — G459 Transient cerebral ischemic attack, unspecified: Secondary | ICD-10-CM | POA: Diagnosis not present

## 2019-08-03 DIAGNOSIS — R269 Unspecified abnormalities of gait and mobility: Secondary | ICD-10-CM | POA: Diagnosis not present

## 2019-08-03 DIAGNOSIS — L0390 Cellulitis, unspecified: Secondary | ICD-10-CM | POA: Diagnosis not present

## 2019-08-03 DIAGNOSIS — R41841 Cognitive communication deficit: Secondary | ICD-10-CM | POA: Diagnosis not present

## 2019-08-03 DIAGNOSIS — I10 Essential (primary) hypertension: Secondary | ICD-10-CM | POA: Diagnosis not present

## 2019-08-03 DIAGNOSIS — K219 Gastro-esophageal reflux disease without esophagitis: Secondary | ICD-10-CM | POA: Diagnosis not present

## 2019-08-03 DIAGNOSIS — E039 Hypothyroidism, unspecified: Secondary | ICD-10-CM | POA: Diagnosis not present

## 2019-08-03 DIAGNOSIS — R4182 Altered mental status, unspecified: Secondary | ICD-10-CM | POA: Diagnosis not present

## 2019-08-03 DIAGNOSIS — G309 Alzheimer's disease, unspecified: Secondary | ICD-10-CM | POA: Diagnosis not present

## 2019-08-03 DIAGNOSIS — Z8673 Personal history of transient ischemic attack (TIA), and cerebral infarction without residual deficits: Secondary | ICD-10-CM | POA: Diagnosis not present

## 2019-08-03 DIAGNOSIS — R9431 Abnormal electrocardiogram [ECG] [EKG]: Secondary | ICD-10-CM | POA: Diagnosis not present

## 2019-08-03 DIAGNOSIS — I5030 Unspecified diastolic (congestive) heart failure: Secondary | ICD-10-CM | POA: Diagnosis not present

## 2019-08-03 DIAGNOSIS — G459 Transient cerebral ischemic attack, unspecified: Secondary | ICD-10-CM | POA: Diagnosis not present

## 2019-08-03 DIAGNOSIS — R29818 Other symptoms and signs involving the nervous system: Secondary | ICD-10-CM | POA: Diagnosis not present

## 2019-08-03 DIAGNOSIS — I2510 Atherosclerotic heart disease of native coronary artery without angina pectoris: Secondary | ICD-10-CM | POA: Diagnosis not present

## 2019-08-03 DIAGNOSIS — I5032 Chronic diastolic (congestive) heart failure: Secondary | ICD-10-CM | POA: Diagnosis not present

## 2019-08-03 DIAGNOSIS — F0390 Unspecified dementia without behavioral disturbance: Secondary | ICD-10-CM | POA: Diagnosis not present

## 2019-08-03 DIAGNOSIS — I639 Cerebral infarction, unspecified: Secondary | ICD-10-CM | POA: Diagnosis not present

## 2019-08-03 DIAGNOSIS — I69391 Dysphagia following cerebral infarction: Secondary | ICD-10-CM | POA: Diagnosis not present

## 2019-08-03 DIAGNOSIS — Z7902 Long term (current) use of antithrombotics/antiplatelets: Secondary | ICD-10-CM | POA: Diagnosis not present

## 2019-08-03 DIAGNOSIS — I129 Hypertensive chronic kidney disease with stage 1 through stage 4 chronic kidney disease, or unspecified chronic kidney disease: Secondary | ICD-10-CM | POA: Diagnosis present

## 2019-08-03 DIAGNOSIS — G4089 Other seizures: Secondary | ICD-10-CM | POA: Diagnosis not present

## 2019-08-03 DIAGNOSIS — E785 Hyperlipidemia, unspecified: Secondary | ICD-10-CM | POA: Diagnosis present

## 2019-08-03 DIAGNOSIS — I63512 Cerebral infarction due to unspecified occlusion or stenosis of left middle cerebral artery: Secondary | ICD-10-CM | POA: Diagnosis present

## 2019-08-03 DIAGNOSIS — R569 Unspecified convulsions: Secondary | ICD-10-CM | POA: Diagnosis not present

## 2019-08-03 DIAGNOSIS — R414 Neurologic neglect syndrome: Secondary | ICD-10-CM | POA: Diagnosis not present

## 2019-08-03 DIAGNOSIS — R404 Transient alteration of awareness: Secondary | ICD-10-CM | POA: Diagnosis not present

## 2019-08-03 DIAGNOSIS — D649 Anemia, unspecified: Secondary | ICD-10-CM | POA: Diagnosis present

## 2019-08-03 DIAGNOSIS — R2991 Unspecified symptoms and signs involving the musculoskeletal system: Secondary | ICD-10-CM | POA: Diagnosis not present

## 2019-08-03 DIAGNOSIS — E1140 Type 2 diabetes mellitus with diabetic neuropathy, unspecified: Secondary | ICD-10-CM | POA: Diagnosis not present

## 2019-08-03 DIAGNOSIS — E119 Type 2 diabetes mellitus without complications: Secondary | ICD-10-CM | POA: Diagnosis not present

## 2019-08-03 DIAGNOSIS — N390 Urinary tract infection, site not specified: Secondary | ICD-10-CM | POA: Diagnosis not present

## 2019-08-03 DIAGNOSIS — R931 Abnormal findings on diagnostic imaging of heart and coronary circulation: Secondary | ICD-10-CM | POA: Diagnosis not present

## 2019-08-03 DIAGNOSIS — I4510 Unspecified right bundle-branch block: Secondary | ICD-10-CM | POA: Diagnosis not present

## 2019-08-03 DIAGNOSIS — R262 Difficulty in walking, not elsewhere classified: Secondary | ICD-10-CM | POA: Diagnosis not present

## 2019-08-03 DIAGNOSIS — N183 Chronic kidney disease, stage 3 unspecified: Secondary | ICD-10-CM | POA: Diagnosis not present

## 2019-08-03 DIAGNOSIS — R4020 Unspecified coma: Secondary | ICD-10-CM | POA: Diagnosis not present

## 2019-08-03 DIAGNOSIS — G9389 Other specified disorders of brain: Secondary | ICD-10-CM | POA: Diagnosis not present

## 2019-08-03 DIAGNOSIS — I083 Combined rheumatic disorders of mitral, aortic and tricuspid valves: Secondary | ICD-10-CM | POA: Diagnosis not present

## 2019-08-03 DIAGNOSIS — E1122 Type 2 diabetes mellitus with diabetic chronic kidney disease: Secondary | ICD-10-CM | POA: Diagnosis present

## 2019-08-03 DIAGNOSIS — R29810 Facial weakness: Secondary | ICD-10-CM | POA: Diagnosis not present

## 2019-08-03 DIAGNOSIS — M6281 Muscle weakness (generalized): Secondary | ICD-10-CM | POA: Diagnosis not present

## 2019-08-03 DIAGNOSIS — I69959 Hemiplegia and hemiparesis following unspecified cerebrovascular disease affecting unspecified side: Secondary | ICD-10-CM | POA: Diagnosis not present

## 2019-08-03 DIAGNOSIS — R0689 Other abnormalities of breathing: Secondary | ICD-10-CM | POA: Diagnosis not present

## 2019-08-03 DIAGNOSIS — I499 Cardiac arrhythmia, unspecified: Secondary | ICD-10-CM | POA: Diagnosis not present

## 2019-08-03 DIAGNOSIS — I69351 Hemiplegia and hemiparesis following cerebral infarction affecting right dominant side: Secondary | ICD-10-CM | POA: Diagnosis not present

## 2019-08-06 DIAGNOSIS — I5030 Unspecified diastolic (congestive) heart failure: Secondary | ICD-10-CM | POA: Diagnosis not present

## 2019-08-06 DIAGNOSIS — B351 Tinea unguium: Secondary | ICD-10-CM | POA: Diagnosis not present

## 2019-08-06 DIAGNOSIS — G4089 Other seizures: Secondary | ICD-10-CM | POA: Diagnosis not present

## 2019-08-06 DIAGNOSIS — N390 Urinary tract infection, site not specified: Secondary | ICD-10-CM | POA: Diagnosis not present

## 2019-08-06 DIAGNOSIS — R41841 Cognitive communication deficit: Secondary | ICD-10-CM | POA: Diagnosis not present

## 2019-08-06 DIAGNOSIS — R569 Unspecified convulsions: Secondary | ICD-10-CM | POA: Diagnosis not present

## 2019-08-06 DIAGNOSIS — E1140 Type 2 diabetes mellitus with diabetic neuropathy, unspecified: Secondary | ICD-10-CM | POA: Diagnosis not present

## 2019-08-06 DIAGNOSIS — R269 Unspecified abnormalities of gait and mobility: Secondary | ICD-10-CM | POA: Diagnosis not present

## 2019-08-06 DIAGNOSIS — E785 Hyperlipidemia, unspecified: Secondary | ICD-10-CM | POA: Diagnosis not present

## 2019-08-06 DIAGNOSIS — I6930 Unspecified sequelae of cerebral infarction: Secondary | ICD-10-CM | POA: Diagnosis not present

## 2019-08-06 DIAGNOSIS — G309 Alzheimer's disease, unspecified: Secondary | ICD-10-CM | POA: Diagnosis not present

## 2019-08-06 DIAGNOSIS — I10 Essential (primary) hypertension: Secondary | ICD-10-CM | POA: Diagnosis not present

## 2019-08-06 DIAGNOSIS — K219 Gastro-esophageal reflux disease without esophagitis: Secondary | ICD-10-CM | POA: Diagnosis not present

## 2019-08-06 DIAGNOSIS — L0390 Cellulitis, unspecified: Secondary | ICD-10-CM | POA: Diagnosis not present

## 2019-08-06 DIAGNOSIS — M6281 Muscle weakness (generalized): Secondary | ICD-10-CM | POA: Diagnosis not present

## 2019-08-06 DIAGNOSIS — F0390 Unspecified dementia without behavioral disturbance: Secondary | ICD-10-CM | POA: Diagnosis not present

## 2019-08-06 DIAGNOSIS — E1159 Type 2 diabetes mellitus with other circulatory complications: Secondary | ICD-10-CM | POA: Diagnosis not present

## 2019-08-06 DIAGNOSIS — R262 Difficulty in walking, not elsewhere classified: Secondary | ICD-10-CM | POA: Diagnosis not present

## 2019-08-06 DIAGNOSIS — G459 Transient cerebral ischemic attack, unspecified: Secondary | ICD-10-CM | POA: Diagnosis not present

## 2019-08-06 DIAGNOSIS — I639 Cerebral infarction, unspecified: Secondary | ICD-10-CM | POA: Diagnosis not present

## 2019-08-06 DIAGNOSIS — I5032 Chronic diastolic (congestive) heart failure: Secondary | ICD-10-CM | POA: Diagnosis not present

## 2019-08-06 DIAGNOSIS — I69959 Hemiplegia and hemiparesis following unspecified cerebrovascular disease affecting unspecified side: Secondary | ICD-10-CM | POA: Diagnosis not present

## 2019-08-06 DIAGNOSIS — E039 Hypothyroidism, unspecified: Secondary | ICD-10-CM | POA: Diagnosis not present

## 2019-08-06 DIAGNOSIS — I69391 Dysphagia following cerebral infarction: Secondary | ICD-10-CM | POA: Diagnosis not present

## 2019-08-06 DIAGNOSIS — I2510 Atherosclerotic heart disease of native coronary artery without angina pectoris: Secondary | ICD-10-CM | POA: Diagnosis not present

## 2019-08-06 DIAGNOSIS — N183 Chronic kidney disease, stage 3 unspecified: Secondary | ICD-10-CM | POA: Diagnosis not present

## 2019-08-06 DIAGNOSIS — I69351 Hemiplegia and hemiparesis following cerebral infarction affecting right dominant side: Secondary | ICD-10-CM | POA: Diagnosis not present

## 2019-08-07 DIAGNOSIS — E1140 Type 2 diabetes mellitus with diabetic neuropathy, unspecified: Secondary | ICD-10-CM | POA: Diagnosis not present

## 2019-08-07 DIAGNOSIS — I6930 Unspecified sequelae of cerebral infarction: Secondary | ICD-10-CM | POA: Diagnosis not present

## 2019-08-07 DIAGNOSIS — R262 Difficulty in walking, not elsewhere classified: Secondary | ICD-10-CM | POA: Diagnosis not present

## 2019-08-07 DIAGNOSIS — M6281 Muscle weakness (generalized): Secondary | ICD-10-CM | POA: Diagnosis not present

## 2019-08-22 DIAGNOSIS — I5030 Unspecified diastolic (congestive) heart failure: Secondary | ICD-10-CM | POA: Diagnosis not present

## 2019-08-22 DIAGNOSIS — F0390 Unspecified dementia without behavioral disturbance: Secondary | ICD-10-CM | POA: Diagnosis not present

## 2019-08-22 DIAGNOSIS — L0390 Cellulitis, unspecified: Secondary | ICD-10-CM | POA: Diagnosis not present

## 2019-08-22 DIAGNOSIS — G459 Transient cerebral ischemic attack, unspecified: Secondary | ICD-10-CM | POA: Diagnosis not present

## 2019-08-29 DIAGNOSIS — B351 Tinea unguium: Secondary | ICD-10-CM | POA: Diagnosis not present

## 2019-08-29 DIAGNOSIS — R41841 Cognitive communication deficit: Secondary | ICD-10-CM | POA: Diagnosis not present

## 2019-08-29 DIAGNOSIS — G309 Alzheimer's disease, unspecified: Secondary | ICD-10-CM | POA: Diagnosis not present

## 2019-08-29 DIAGNOSIS — E1159 Type 2 diabetes mellitus with other circulatory complications: Secondary | ICD-10-CM | POA: Diagnosis not present

## 2019-08-29 DIAGNOSIS — F0390 Unspecified dementia without behavioral disturbance: Secondary | ICD-10-CM | POA: Diagnosis not present

## 2019-08-29 DIAGNOSIS — I69351 Hemiplegia and hemiparesis following cerebral infarction affecting right dominant side: Secondary | ICD-10-CM | POA: Diagnosis not present

## 2019-08-29 DIAGNOSIS — E1140 Type 2 diabetes mellitus with diabetic neuropathy, unspecified: Secondary | ICD-10-CM | POA: Diagnosis not present

## 2019-10-05 DIAGNOSIS — E119 Type 2 diabetes mellitus without complications: Secondary | ICD-10-CM | POA: Diagnosis not present

## 2019-10-05 DIAGNOSIS — E1140 Type 2 diabetes mellitus with diabetic neuropathy, unspecified: Secondary | ICD-10-CM | POA: Diagnosis not present

## 2019-10-05 DIAGNOSIS — I639 Cerebral infarction, unspecified: Secondary | ICD-10-CM | POA: Diagnosis not present

## 2019-10-05 DIAGNOSIS — R531 Weakness: Secondary | ICD-10-CM | POA: Diagnosis not present

## 2019-11-07 DIAGNOSIS — E039 Hypothyroidism, unspecified: Secondary | ICD-10-CM | POA: Diagnosis not present

## 2019-11-07 DIAGNOSIS — K5900 Constipation, unspecified: Secondary | ICD-10-CM | POA: Diagnosis not present

## 2019-11-07 DIAGNOSIS — I10 Essential (primary) hypertension: Secondary | ICD-10-CM | POA: Diagnosis not present

## 2019-11-07 DIAGNOSIS — E119 Type 2 diabetes mellitus without complications: Secondary | ICD-10-CM | POA: Diagnosis not present

## 2019-11-23 DIAGNOSIS — I69391 Dysphagia following cerebral infarction: Secondary | ICD-10-CM | POA: Diagnosis not present

## 2019-11-23 DIAGNOSIS — R262 Difficulty in walking, not elsewhere classified: Secondary | ICD-10-CM | POA: Diagnosis not present

## 2019-11-23 DIAGNOSIS — I10 Essential (primary) hypertension: Secondary | ICD-10-CM | POA: Diagnosis not present

## 2019-11-23 DIAGNOSIS — E039 Hypothyroidism, unspecified: Secondary | ICD-10-CM | POA: Diagnosis not present

## 2019-11-23 DIAGNOSIS — E785 Hyperlipidemia, unspecified: Secondary | ICD-10-CM | POA: Diagnosis not present

## 2019-11-23 DIAGNOSIS — I639 Cerebral infarction, unspecified: Secondary | ICD-10-CM | POA: Diagnosis not present

## 2019-11-23 DIAGNOSIS — E119 Type 2 diabetes mellitus without complications: Secondary | ICD-10-CM | POA: Diagnosis not present

## 2019-11-23 DIAGNOSIS — I69351 Hemiplegia and hemiparesis following cerebral infarction affecting right dominant side: Secondary | ICD-10-CM | POA: Diagnosis not present

## 2019-11-23 DIAGNOSIS — M6281 Muscle weakness (generalized): Secondary | ICD-10-CM | POA: Diagnosis not present

## 2019-11-23 DIAGNOSIS — R41841 Cognitive communication deficit: Secondary | ICD-10-CM | POA: Diagnosis not present

## 2019-11-24 DIAGNOSIS — E1140 Type 2 diabetes mellitus with diabetic neuropathy, unspecified: Secondary | ICD-10-CM | POA: Diagnosis not present

## 2019-11-28 DIAGNOSIS — F0390 Unspecified dementia without behavioral disturbance: Secondary | ICD-10-CM | POA: Diagnosis not present

## 2019-11-28 DIAGNOSIS — E1140 Type 2 diabetes mellitus with diabetic neuropathy, unspecified: Secondary | ICD-10-CM | POA: Diagnosis not present

## 2019-11-28 DIAGNOSIS — G309 Alzheimer's disease, unspecified: Secondary | ICD-10-CM | POA: Diagnosis not present

## 2019-11-28 DIAGNOSIS — I10 Essential (primary) hypertension: Secondary | ICD-10-CM | POA: Diagnosis not present

## 2019-11-28 DIAGNOSIS — E039 Hypothyroidism, unspecified: Secondary | ICD-10-CM | POA: Diagnosis not present

## 2019-11-28 DIAGNOSIS — I639 Cerebral infarction, unspecified: Secondary | ICD-10-CM | POA: Diagnosis not present

## 2019-11-28 DIAGNOSIS — I69351 Hemiplegia and hemiparesis following cerebral infarction affecting right dominant side: Secondary | ICD-10-CM | POA: Diagnosis not present

## 2019-11-28 DIAGNOSIS — G459 Transient cerebral ischemic attack, unspecified: Secondary | ICD-10-CM | POA: Diagnosis not present

## 2019-11-28 DIAGNOSIS — B351 Tinea unguium: Secondary | ICD-10-CM | POA: Diagnosis not present

## 2019-12-08 DIAGNOSIS — R040 Epistaxis: Secondary | ICD-10-CM | POA: Diagnosis not present

## 2020-01-03 DIAGNOSIS — G459 Transient cerebral ischemic attack, unspecified: Secondary | ICD-10-CM | POA: Diagnosis not present

## 2020-01-03 DIAGNOSIS — E039 Hypothyroidism, unspecified: Secondary | ICD-10-CM | POA: Diagnosis not present

## 2020-01-03 DIAGNOSIS — F0390 Unspecified dementia without behavioral disturbance: Secondary | ICD-10-CM | POA: Diagnosis not present

## 2020-01-03 DIAGNOSIS — E119 Type 2 diabetes mellitus without complications: Secondary | ICD-10-CM | POA: Diagnosis not present

## 2020-01-10 DIAGNOSIS — E119 Type 2 diabetes mellitus without complications: Secondary | ICD-10-CM | POA: Diagnosis not present

## 2020-01-10 DIAGNOSIS — I63449 Cerebral infarction due to embolism of unspecified cerebellar artery: Secondary | ICD-10-CM | POA: Diagnosis not present

## 2020-01-10 DIAGNOSIS — E039 Hypothyroidism, unspecified: Secondary | ICD-10-CM | POA: Diagnosis not present

## 2020-01-10 DIAGNOSIS — F0390 Unspecified dementia without behavioral disturbance: Secondary | ICD-10-CM | POA: Diagnosis not present

## 2020-01-11 DIAGNOSIS — Z23 Encounter for immunization: Secondary | ICD-10-CM | POA: Diagnosis not present

## 2020-01-30 DIAGNOSIS — Z1159 Encounter for screening for other viral diseases: Secondary | ICD-10-CM | POA: Diagnosis not present

## 2020-01-30 DIAGNOSIS — Z23 Encounter for immunization: Secondary | ICD-10-CM | POA: Diagnosis not present

## 2020-02-06 DIAGNOSIS — Z1159 Encounter for screening for other viral diseases: Secondary | ICD-10-CM | POA: Diagnosis not present

## 2020-02-06 DIAGNOSIS — Z23 Encounter for immunization: Secondary | ICD-10-CM | POA: Diagnosis not present

## 2020-02-15 DIAGNOSIS — Z1159 Encounter for screening for other viral diseases: Secondary | ICD-10-CM | POA: Diagnosis not present

## 2020-02-15 DIAGNOSIS — Z23 Encounter for immunization: Secondary | ICD-10-CM | POA: Diagnosis not present

## 2020-02-21 DIAGNOSIS — Z23 Encounter for immunization: Secondary | ICD-10-CM | POA: Diagnosis not present

## 2020-02-21 DIAGNOSIS — Z1159 Encounter for screening for other viral diseases: Secondary | ICD-10-CM | POA: Diagnosis not present

## 2020-03-06 DIAGNOSIS — K5900 Constipation, unspecified: Secondary | ICD-10-CM | POA: Diagnosis not present

## 2020-03-06 DIAGNOSIS — I10 Essential (primary) hypertension: Secondary | ICD-10-CM | POA: Diagnosis not present

## 2020-03-06 DIAGNOSIS — F0390 Unspecified dementia without behavioral disturbance: Secondary | ICD-10-CM | POA: Diagnosis not present

## 2020-03-06 DIAGNOSIS — R609 Edema, unspecified: Secondary | ICD-10-CM | POA: Diagnosis not present

## 2020-03-06 DIAGNOSIS — I639 Cerebral infarction, unspecified: Secondary | ICD-10-CM | POA: Diagnosis not present

## 2020-03-06 DIAGNOSIS — E119 Type 2 diabetes mellitus without complications: Secondary | ICD-10-CM | POA: Diagnosis not present

## 2020-03-06 DIAGNOSIS — I5030 Unspecified diastolic (congestive) heart failure: Secondary | ICD-10-CM | POA: Diagnosis not present

## 2020-03-06 DIAGNOSIS — I63449 Cerebral infarction due to embolism of unspecified cerebellar artery: Secondary | ICD-10-CM | POA: Diagnosis not present

## 2020-03-06 DIAGNOSIS — G459 Transient cerebral ischemic attack, unspecified: Secondary | ICD-10-CM | POA: Diagnosis not present

## 2020-03-06 DIAGNOSIS — N390 Urinary tract infection, site not specified: Secondary | ICD-10-CM | POA: Diagnosis not present

## 2020-03-06 DIAGNOSIS — E039 Hypothyroidism, unspecified: Secondary | ICD-10-CM | POA: Diagnosis not present

## 2020-03-06 DIAGNOSIS — M109 Gout, unspecified: Secondary | ICD-10-CM | POA: Diagnosis not present

## 2020-04-01 DIAGNOSIS — M6281 Muscle weakness (generalized): Secondary | ICD-10-CM | POA: Diagnosis not present

## 2020-04-01 DIAGNOSIS — Z1159 Encounter for screening for other viral diseases: Secondary | ICD-10-CM | POA: Diagnosis not present

## 2020-04-01 DIAGNOSIS — I69351 Hemiplegia and hemiparesis following cerebral infarction affecting right dominant side: Secondary | ICD-10-CM | POA: Diagnosis not present

## 2020-04-01 DIAGNOSIS — R1310 Dysphagia, unspecified: Secondary | ICD-10-CM | POA: Diagnosis not present

## 2020-04-08 DIAGNOSIS — Z1159 Encounter for screening for other viral diseases: Secondary | ICD-10-CM | POA: Diagnosis not present

## 2020-04-08 DIAGNOSIS — M6281 Muscle weakness (generalized): Secondary | ICD-10-CM | POA: Diagnosis not present

## 2020-04-08 DIAGNOSIS — R1310 Dysphagia, unspecified: Secondary | ICD-10-CM | POA: Diagnosis not present

## 2020-04-08 DIAGNOSIS — I69351 Hemiplegia and hemiparesis following cerebral infarction affecting right dominant side: Secondary | ICD-10-CM | POA: Diagnosis not present

## 2020-04-15 DIAGNOSIS — R1310 Dysphagia, unspecified: Secondary | ICD-10-CM | POA: Diagnosis not present

## 2020-04-15 DIAGNOSIS — M6281 Muscle weakness (generalized): Secondary | ICD-10-CM | POA: Diagnosis not present

## 2020-04-15 DIAGNOSIS — I69351 Hemiplegia and hemiparesis following cerebral infarction affecting right dominant side: Secondary | ICD-10-CM | POA: Diagnosis not present

## 2020-04-17 DIAGNOSIS — I69351 Hemiplegia and hemiparesis following cerebral infarction affecting right dominant side: Secondary | ICD-10-CM | POA: Diagnosis not present

## 2020-04-17 DIAGNOSIS — M6281 Muscle weakness (generalized): Secondary | ICD-10-CM | POA: Diagnosis not present

## 2020-04-17 DIAGNOSIS — R1310 Dysphagia, unspecified: Secondary | ICD-10-CM | POA: Diagnosis not present

## 2020-04-18 DIAGNOSIS — I69351 Hemiplegia and hemiparesis following cerebral infarction affecting right dominant side: Secondary | ICD-10-CM | POA: Diagnosis not present

## 2020-04-18 DIAGNOSIS — R1310 Dysphagia, unspecified: Secondary | ICD-10-CM | POA: Diagnosis not present

## 2020-04-18 DIAGNOSIS — M6281 Muscle weakness (generalized): Secondary | ICD-10-CM | POA: Diagnosis not present

## 2020-04-19 DIAGNOSIS — I10 Essential (primary) hypertension: Secondary | ICD-10-CM | POA: Diagnosis not present

## 2020-04-19 DIAGNOSIS — I69351 Hemiplegia and hemiparesis following cerebral infarction affecting right dominant side: Secondary | ICD-10-CM | POA: Diagnosis not present

## 2020-04-19 DIAGNOSIS — I2510 Atherosclerotic heart disease of native coronary artery without angina pectoris: Secondary | ICD-10-CM | POA: Diagnosis not present

## 2020-04-19 DIAGNOSIS — I63449 Cerebral infarction due to embolism of unspecified cerebellar artery: Secondary | ICD-10-CM | POA: Diagnosis not present

## 2020-04-19 DIAGNOSIS — M6281 Muscle weakness (generalized): Secondary | ICD-10-CM | POA: Diagnosis not present

## 2020-04-19 DIAGNOSIS — R1310 Dysphagia, unspecified: Secondary | ICD-10-CM | POA: Diagnosis not present

## 2020-04-22 DIAGNOSIS — R1310 Dysphagia, unspecified: Secondary | ICD-10-CM | POA: Diagnosis not present

## 2020-04-22 DIAGNOSIS — I69351 Hemiplegia and hemiparesis following cerebral infarction affecting right dominant side: Secondary | ICD-10-CM | POA: Diagnosis not present

## 2020-04-22 DIAGNOSIS — M6281 Muscle weakness (generalized): Secondary | ICD-10-CM | POA: Diagnosis not present

## 2020-04-23 DIAGNOSIS — M6281 Muscle weakness (generalized): Secondary | ICD-10-CM | POA: Diagnosis not present

## 2020-04-23 DIAGNOSIS — R1310 Dysphagia, unspecified: Secondary | ICD-10-CM | POA: Diagnosis not present

## 2020-04-23 DIAGNOSIS — I69351 Hemiplegia and hemiparesis following cerebral infarction affecting right dominant side: Secondary | ICD-10-CM | POA: Diagnosis not present

## 2020-04-24 DIAGNOSIS — M6281 Muscle weakness (generalized): Secondary | ICD-10-CM | POA: Diagnosis not present

## 2020-04-24 DIAGNOSIS — I69351 Hemiplegia and hemiparesis following cerebral infarction affecting right dominant side: Secondary | ICD-10-CM | POA: Diagnosis not present

## 2020-04-24 DIAGNOSIS — R1310 Dysphagia, unspecified: Secondary | ICD-10-CM | POA: Diagnosis not present

## 2020-04-25 DIAGNOSIS — M6281 Muscle weakness (generalized): Secondary | ICD-10-CM | POA: Diagnosis not present

## 2020-04-25 DIAGNOSIS — R1310 Dysphagia, unspecified: Secondary | ICD-10-CM | POA: Diagnosis not present

## 2020-04-25 DIAGNOSIS — I69351 Hemiplegia and hemiparesis following cerebral infarction affecting right dominant side: Secondary | ICD-10-CM | POA: Diagnosis not present

## 2020-04-26 DIAGNOSIS — M6281 Muscle weakness (generalized): Secondary | ICD-10-CM | POA: Diagnosis not present

## 2020-04-26 DIAGNOSIS — I69351 Hemiplegia and hemiparesis following cerebral infarction affecting right dominant side: Secondary | ICD-10-CM | POA: Diagnosis not present

## 2020-04-29 DIAGNOSIS — M6281 Muscle weakness (generalized): Secondary | ICD-10-CM | POA: Diagnosis not present

## 2020-04-29 DIAGNOSIS — I69351 Hemiplegia and hemiparesis following cerebral infarction affecting right dominant side: Secondary | ICD-10-CM | POA: Diagnosis not present

## 2020-04-30 DIAGNOSIS — B351 Tinea unguium: Secondary | ICD-10-CM | POA: Diagnosis not present

## 2020-04-30 DIAGNOSIS — M6281 Muscle weakness (generalized): Secondary | ICD-10-CM | POA: Diagnosis not present

## 2020-04-30 DIAGNOSIS — F0390 Unspecified dementia without behavioral disturbance: Secondary | ICD-10-CM | POA: Diagnosis not present

## 2020-04-30 DIAGNOSIS — E1140 Type 2 diabetes mellitus with diabetic neuropathy, unspecified: Secondary | ICD-10-CM | POA: Diagnosis not present

## 2020-04-30 DIAGNOSIS — G309 Alzheimer's disease, unspecified: Secondary | ICD-10-CM | POA: Diagnosis not present

## 2020-04-30 DIAGNOSIS — E1159 Type 2 diabetes mellitus with other circulatory complications: Secondary | ICD-10-CM | POA: Diagnosis not present

## 2020-04-30 DIAGNOSIS — R41841 Cognitive communication deficit: Secondary | ICD-10-CM | POA: Diagnosis not present

## 2020-04-30 DIAGNOSIS — I69351 Hemiplegia and hemiparesis following cerebral infarction affecting right dominant side: Secondary | ICD-10-CM | POA: Diagnosis not present

## 2020-05-01 DIAGNOSIS — I69351 Hemiplegia and hemiparesis following cerebral infarction affecting right dominant side: Secondary | ICD-10-CM | POA: Diagnosis not present

## 2020-05-01 DIAGNOSIS — M6281 Muscle weakness (generalized): Secondary | ICD-10-CM | POA: Diagnosis not present

## 2020-05-02 DIAGNOSIS — M6281 Muscle weakness (generalized): Secondary | ICD-10-CM | POA: Diagnosis not present

## 2020-05-02 DIAGNOSIS — I69351 Hemiplegia and hemiparesis following cerebral infarction affecting right dominant side: Secondary | ICD-10-CM | POA: Diagnosis not present

## 2020-05-03 DIAGNOSIS — M6281 Muscle weakness (generalized): Secondary | ICD-10-CM | POA: Diagnosis not present

## 2020-05-03 DIAGNOSIS — I69351 Hemiplegia and hemiparesis following cerebral infarction affecting right dominant side: Secondary | ICD-10-CM | POA: Diagnosis not present

## 2020-05-06 DIAGNOSIS — I69351 Hemiplegia and hemiparesis following cerebral infarction affecting right dominant side: Secondary | ICD-10-CM | POA: Diagnosis not present

## 2020-05-06 DIAGNOSIS — M6281 Muscle weakness (generalized): Secondary | ICD-10-CM | POA: Diagnosis not present

## 2020-05-07 DIAGNOSIS — I69351 Hemiplegia and hemiparesis following cerebral infarction affecting right dominant side: Secondary | ICD-10-CM | POA: Diagnosis not present

## 2020-05-07 DIAGNOSIS — M6281 Muscle weakness (generalized): Secondary | ICD-10-CM | POA: Diagnosis not present

## 2020-05-08 DIAGNOSIS — M6281 Muscle weakness (generalized): Secondary | ICD-10-CM | POA: Diagnosis not present

## 2020-05-08 DIAGNOSIS — I69351 Hemiplegia and hemiparesis following cerebral infarction affecting right dominant side: Secondary | ICD-10-CM | POA: Diagnosis not present

## 2020-05-09 DIAGNOSIS — I69351 Hemiplegia and hemiparesis following cerebral infarction affecting right dominant side: Secondary | ICD-10-CM | POA: Diagnosis not present

## 2020-05-09 DIAGNOSIS — R609 Edema, unspecified: Secondary | ICD-10-CM | POA: Diagnosis not present

## 2020-05-09 DIAGNOSIS — M6281 Muscle weakness (generalized): Secondary | ICD-10-CM | POA: Diagnosis not present

## 2020-05-10 DIAGNOSIS — M6281 Muscle weakness (generalized): Secondary | ICD-10-CM | POA: Diagnosis not present

## 2020-05-10 DIAGNOSIS — I69351 Hemiplegia and hemiparesis following cerebral infarction affecting right dominant side: Secondary | ICD-10-CM | POA: Diagnosis not present

## 2020-05-14 DIAGNOSIS — H04123 Dry eye syndrome of bilateral lacrimal glands: Secondary | ICD-10-CM | POA: Diagnosis not present

## 2020-05-20 DIAGNOSIS — S80261A Insect bite (nonvenomous), right knee, initial encounter: Secondary | ICD-10-CM | POA: Diagnosis not present

## 2020-05-20 DIAGNOSIS — W57XXXA Bitten or stung by nonvenomous insect and other nonvenomous arthropods, initial encounter: Secondary | ICD-10-CM | POA: Diagnosis not present

## 2020-05-21 DIAGNOSIS — S80261A Insect bite (nonvenomous), right knee, initial encounter: Secondary | ICD-10-CM | POA: Diagnosis not present

## 2020-05-22 DIAGNOSIS — W57XXXA Bitten or stung by nonvenomous insect and other nonvenomous arthropods, initial encounter: Secondary | ICD-10-CM | POA: Diagnosis not present

## 2020-05-22 DIAGNOSIS — S80261A Insect bite (nonvenomous), right knee, initial encounter: Secondary | ICD-10-CM | POA: Diagnosis not present

## 2020-05-27 DIAGNOSIS — S80261A Insect bite (nonvenomous), right knee, initial encounter: Secondary | ICD-10-CM | POA: Diagnosis not present

## 2020-06-04 DIAGNOSIS — S80261A Insect bite (nonvenomous), right knee, initial encounter: Secondary | ICD-10-CM | POA: Diagnosis not present

## 2020-06-12 DIAGNOSIS — Z23 Encounter for immunization: Secondary | ICD-10-CM | POA: Diagnosis not present

## 2020-07-01 DIAGNOSIS — E1140 Type 2 diabetes mellitus with diabetic neuropathy, unspecified: Secondary | ICD-10-CM | POA: Diagnosis not present

## 2020-07-01 DIAGNOSIS — I10 Essential (primary) hypertension: Secondary | ICD-10-CM | POA: Diagnosis not present

## 2020-07-01 DIAGNOSIS — F0390 Unspecified dementia without behavioral disturbance: Secondary | ICD-10-CM | POA: Diagnosis not present

## 2020-07-01 DIAGNOSIS — K5900 Constipation, unspecified: Secondary | ICD-10-CM | POA: Diagnosis not present

## 2020-07-01 DIAGNOSIS — I639 Cerebral infarction, unspecified: Secondary | ICD-10-CM | POA: Diagnosis not present

## 2020-07-01 DIAGNOSIS — G4089 Other seizures: Secondary | ICD-10-CM | POA: Diagnosis not present

## 2020-07-01 DIAGNOSIS — E119 Type 2 diabetes mellitus without complications: Secondary | ICD-10-CM | POA: Diagnosis not present

## 2020-07-01 DIAGNOSIS — G309 Alzheimer's disease, unspecified: Secondary | ICD-10-CM | POA: Diagnosis not present

## 2020-07-01 DIAGNOSIS — E039 Hypothyroidism, unspecified: Secondary | ICD-10-CM | POA: Diagnosis not present

## 2020-07-01 DIAGNOSIS — G459 Transient cerebral ischemic attack, unspecified: Secondary | ICD-10-CM | POA: Diagnosis not present

## 2020-07-01 DIAGNOSIS — M6281 Muscle weakness (generalized): Secondary | ICD-10-CM | POA: Diagnosis not present

## 2020-07-01 DIAGNOSIS — M109 Gout, unspecified: Secondary | ICD-10-CM | POA: Diagnosis not present

## 2020-07-05 DIAGNOSIS — G309 Alzheimer's disease, unspecified: Secondary | ICD-10-CM | POA: Diagnosis not present

## 2020-07-05 DIAGNOSIS — F0390 Unspecified dementia without behavioral disturbance: Secondary | ICD-10-CM | POA: Diagnosis not present

## 2020-07-05 DIAGNOSIS — H04123 Dry eye syndrome of bilateral lacrimal glands: Secondary | ICD-10-CM | POA: Diagnosis not present

## 2020-08-06 DIAGNOSIS — E1140 Type 2 diabetes mellitus with diabetic neuropathy, unspecified: Secondary | ICD-10-CM | POA: Diagnosis not present

## 2020-08-06 DIAGNOSIS — I639 Cerebral infarction, unspecified: Secondary | ICD-10-CM | POA: Diagnosis not present

## 2020-08-06 DIAGNOSIS — I69351 Hemiplegia and hemiparesis following cerebral infarction affecting right dominant side: Secondary | ICD-10-CM | POA: Diagnosis not present

## 2020-08-06 DIAGNOSIS — B351 Tinea unguium: Secondary | ICD-10-CM | POA: Diagnosis not present

## 2020-08-06 DIAGNOSIS — G309 Alzheimer's disease, unspecified: Secondary | ICD-10-CM | POA: Diagnosis not present

## 2020-08-15 DIAGNOSIS — S81811A Laceration without foreign body, right lower leg, initial encounter: Secondary | ICD-10-CM | POA: Diagnosis not present

## 2020-08-16 DIAGNOSIS — N390 Urinary tract infection, site not specified: Secondary | ICD-10-CM | POA: Diagnosis not present

## 2020-08-16 DIAGNOSIS — M6281 Muscle weakness (generalized): Secondary | ICD-10-CM | POA: Diagnosis not present

## 2020-08-16 DIAGNOSIS — I10 Essential (primary) hypertension: Secondary | ICD-10-CM | POA: Diagnosis not present

## 2020-08-16 DIAGNOSIS — N183 Chronic kidney disease, stage 3 unspecified: Secondary | ICD-10-CM | POA: Diagnosis not present

## 2020-08-17 DIAGNOSIS — R739 Hyperglycemia, unspecified: Secondary | ICD-10-CM | POA: Diagnosis not present

## 2020-08-17 DIAGNOSIS — Z1629 Resistance to other single specified antibiotic: Secondary | ICD-10-CM | POA: Diagnosis present

## 2020-08-17 DIAGNOSIS — I639 Cerebral infarction, unspecified: Secondary | ICD-10-CM | POA: Diagnosis not present

## 2020-08-17 DIAGNOSIS — Z79899 Other long term (current) drug therapy: Secondary | ICD-10-CM | POA: Diagnosis not present

## 2020-08-17 DIAGNOSIS — G9341 Metabolic encephalopathy: Secondary | ICD-10-CM | POA: Diagnosis present

## 2020-08-17 DIAGNOSIS — M6281 Muscle weakness (generalized): Secondary | ICD-10-CM | POA: Diagnosis not present

## 2020-08-17 DIAGNOSIS — Z882 Allergy status to sulfonamides status: Secondary | ICD-10-CM | POA: Diagnosis not present

## 2020-08-17 DIAGNOSIS — I4891 Unspecified atrial fibrillation: Secondary | ICD-10-CM | POA: Diagnosis not present

## 2020-08-17 DIAGNOSIS — I110 Hypertensive heart disease with heart failure: Secondary | ICD-10-CM | POA: Diagnosis present

## 2020-08-17 DIAGNOSIS — I5030 Unspecified diastolic (congestive) heart failure: Secondary | ICD-10-CM | POA: Diagnosis not present

## 2020-08-17 DIAGNOSIS — K5900 Constipation, unspecified: Secondary | ICD-10-CM | POA: Diagnosis not present

## 2020-08-17 DIAGNOSIS — Z23 Encounter for immunization: Secondary | ICD-10-CM | POA: Diagnosis not present

## 2020-08-17 DIAGNOSIS — I4510 Unspecified right bundle-branch block: Secondary | ICD-10-CM | POA: Diagnosis not present

## 2020-08-17 DIAGNOSIS — A4151 Sepsis due to Escherichia coli [E. coli]: Secondary | ICD-10-CM | POA: Diagnosis not present

## 2020-08-17 DIAGNOSIS — I69391 Dysphagia following cerebral infarction: Secondary | ICD-10-CM | POA: Diagnosis not present

## 2020-08-17 DIAGNOSIS — J189 Pneumonia, unspecified organism: Secondary | ICD-10-CM | POA: Diagnosis not present

## 2020-08-17 DIAGNOSIS — R4182 Altered mental status, unspecified: Secondary | ICD-10-CM | POA: Diagnosis not present

## 2020-08-17 DIAGNOSIS — B9620 Unspecified Escherichia coli [E. coli] as the cause of diseases classified elsewhere: Secondary | ICD-10-CM | POA: Diagnosis present

## 2020-08-17 DIAGNOSIS — Z7984 Long term (current) use of oral hypoglycemic drugs: Secondary | ICD-10-CM | POA: Diagnosis not present

## 2020-08-17 DIAGNOSIS — R41841 Cognitive communication deficit: Secondary | ICD-10-CM | POA: Diagnosis not present

## 2020-08-17 DIAGNOSIS — Z20822 Contact with and (suspected) exposure to covid-19: Secondary | ICD-10-CM | POA: Diagnosis not present

## 2020-08-17 DIAGNOSIS — G4089 Other seizures: Secondary | ICD-10-CM | POA: Diagnosis not present

## 2020-08-17 DIAGNOSIS — I10 Essential (primary) hypertension: Secondary | ICD-10-CM | POA: Diagnosis not present

## 2020-08-17 DIAGNOSIS — R1312 Dysphagia, oropharyngeal phase: Secondary | ICD-10-CM | POA: Diagnosis not present

## 2020-08-17 DIAGNOSIS — Z7982 Long term (current) use of aspirin: Secondary | ICD-10-CM | POA: Diagnosis not present

## 2020-08-17 DIAGNOSIS — Z91048 Other nonmedicinal substance allergy status: Secondary | ICD-10-CM | POA: Diagnosis not present

## 2020-08-17 DIAGNOSIS — Z8673 Personal history of transient ischemic attack (TIA), and cerebral infarction without residual deficits: Secondary | ICD-10-CM | POA: Diagnosis not present

## 2020-08-17 DIAGNOSIS — E785 Hyperlipidemia, unspecified: Secondary | ICD-10-CM | POA: Diagnosis present

## 2020-08-17 DIAGNOSIS — R404 Transient alteration of awareness: Secondary | ICD-10-CM | POA: Diagnosis not present

## 2020-08-17 DIAGNOSIS — I63449 Cerebral infarction due to embolism of unspecified cerebellar artery: Secondary | ICD-10-CM | POA: Diagnosis not present

## 2020-08-17 DIAGNOSIS — F0390 Unspecified dementia without behavioral disturbance: Secondary | ICD-10-CM | POA: Diagnosis not present

## 2020-08-17 DIAGNOSIS — A419 Sepsis, unspecified organism: Secondary | ICD-10-CM | POA: Diagnosis not present

## 2020-08-17 DIAGNOSIS — I5032 Chronic diastolic (congestive) heart failure: Secondary | ICD-10-CM | POA: Diagnosis present

## 2020-08-17 DIAGNOSIS — Z7902 Long term (current) use of antithrombotics/antiplatelets: Secondary | ICD-10-CM | POA: Diagnosis not present

## 2020-08-17 DIAGNOSIS — M109 Gout, unspecified: Secondary | ICD-10-CM | POA: Diagnosis not present

## 2020-08-17 DIAGNOSIS — E1142 Type 2 diabetes mellitus with diabetic polyneuropathy: Secondary | ICD-10-CM | POA: Diagnosis present

## 2020-08-17 DIAGNOSIS — F0391 Unspecified dementia with behavioral disturbance: Secondary | ICD-10-CM | POA: Diagnosis not present

## 2020-08-17 DIAGNOSIS — N390 Urinary tract infection, site not specified: Secondary | ICD-10-CM | POA: Diagnosis not present

## 2020-08-17 DIAGNOSIS — E039 Hypothyroidism, unspecified: Secondary | ICD-10-CM | POA: Diagnosis not present

## 2020-08-17 DIAGNOSIS — R6520 Severe sepsis without septic shock: Secondary | ICD-10-CM | POA: Diagnosis not present

## 2020-08-17 DIAGNOSIS — E1140 Type 2 diabetes mellitus with diabetic neuropathy, unspecified: Secondary | ICD-10-CM | POA: Diagnosis not present

## 2020-08-17 DIAGNOSIS — R293 Abnormal posture: Secondary | ICD-10-CM | POA: Diagnosis not present

## 2020-08-17 DIAGNOSIS — B9561 Methicillin susceptible Staphylococcus aureus infection as the cause of diseases classified elsewhere: Secondary | ICD-10-CM | POA: Diagnosis not present

## 2020-08-17 DIAGNOSIS — G459 Transient cerebral ischemic attack, unspecified: Secondary | ICD-10-CM | POA: Diagnosis not present

## 2020-08-17 DIAGNOSIS — R0602 Shortness of breath: Secondary | ICD-10-CM | POA: Diagnosis not present

## 2020-08-17 DIAGNOSIS — Z1159 Encounter for screening for other viral diseases: Secondary | ICD-10-CM | POA: Diagnosis not present

## 2020-08-17 DIAGNOSIS — I69351 Hemiplegia and hemiparesis following cerebral infarction affecting right dominant side: Secondary | ICD-10-CM | POA: Diagnosis not present

## 2020-08-17 DIAGNOSIS — I2510 Atherosclerotic heart disease of native coronary artery without angina pectoris: Secondary | ICD-10-CM | POA: Diagnosis not present

## 2020-08-17 DIAGNOSIS — Z7401 Bed confinement status: Secondary | ICD-10-CM | POA: Diagnosis not present

## 2020-08-17 DIAGNOSIS — G40909 Epilepsy, unspecified, not intractable, without status epilepticus: Secondary | ICD-10-CM | POA: Diagnosis present

## 2020-08-17 DIAGNOSIS — G309 Alzheimer's disease, unspecified: Secondary | ICD-10-CM | POA: Diagnosis not present

## 2020-08-17 DIAGNOSIS — E119 Type 2 diabetes mellitus without complications: Secondary | ICD-10-CM | POA: Diagnosis not present

## 2020-08-17 DIAGNOSIS — J690 Pneumonitis due to inhalation of food and vomit: Secondary | ICD-10-CM | POA: Diagnosis present

## 2020-08-17 DIAGNOSIS — R1310 Dysphagia, unspecified: Secondary | ICD-10-CM | POA: Diagnosis not present

## 2020-08-17 DIAGNOSIS — Z4682 Encounter for fitting and adjustment of non-vascular catheter: Secondary | ICD-10-CM | POA: Diagnosis not present

## 2020-08-17 DIAGNOSIS — Z87440 Personal history of urinary (tract) infections: Secondary | ICD-10-CM | POA: Diagnosis not present

## 2020-08-17 DIAGNOSIS — N183 Chronic kidney disease, stage 3 unspecified: Secondary | ICD-10-CM | POA: Diagnosis not present

## 2020-08-17 DIAGNOSIS — A4101 Sepsis due to Methicillin susceptible Staphylococcus aureus: Secondary | ICD-10-CM | POA: Diagnosis not present

## 2020-08-17 DIAGNOSIS — K219 Gastro-esophageal reflux disease without esophagitis: Secondary | ICD-10-CM | POA: Diagnosis not present

## 2020-08-17 DIAGNOSIS — Z1612 Extended spectrum beta lactamase (ESBL) resistance: Secondary | ICD-10-CM | POA: Diagnosis present

## 2020-08-19 DIAGNOSIS — I5030 Unspecified diastolic (congestive) heart failure: Secondary | ICD-10-CM | POA: Diagnosis not present

## 2020-08-19 DIAGNOSIS — F0390 Unspecified dementia without behavioral disturbance: Secondary | ICD-10-CM | POA: Diagnosis not present

## 2020-08-19 DIAGNOSIS — G309 Alzheimer's disease, unspecified: Secondary | ICD-10-CM | POA: Diagnosis not present

## 2020-08-19 DIAGNOSIS — I10 Essential (primary) hypertension: Secondary | ICD-10-CM | POA: Diagnosis not present

## 2020-08-19 DIAGNOSIS — K5900 Constipation, unspecified: Secondary | ICD-10-CM | POA: Diagnosis not present

## 2020-08-19 DIAGNOSIS — M109 Gout, unspecified: Secondary | ICD-10-CM | POA: Diagnosis not present

## 2020-08-19 DIAGNOSIS — K219 Gastro-esophageal reflux disease without esophagitis: Secondary | ICD-10-CM | POA: Diagnosis not present

## 2020-08-19 DIAGNOSIS — I63449 Cerebral infarction due to embolism of unspecified cerebellar artery: Secondary | ICD-10-CM | POA: Diagnosis not present

## 2020-08-19 DIAGNOSIS — N390 Urinary tract infection, site not specified: Secondary | ICD-10-CM | POA: Diagnosis not present

## 2020-08-19 DIAGNOSIS — E119 Type 2 diabetes mellitus without complications: Secondary | ICD-10-CM | POA: Diagnosis not present

## 2020-08-19 DIAGNOSIS — E039 Hypothyroidism, unspecified: Secondary | ICD-10-CM | POA: Diagnosis not present

## 2020-08-19 DIAGNOSIS — G459 Transient cerebral ischemic attack, unspecified: Secondary | ICD-10-CM | POA: Diagnosis not present

## 2020-08-22 DIAGNOSIS — F0280 Dementia in other diseases classified elsewhere without behavioral disturbance: Secondary | ICD-10-CM | POA: Diagnosis not present

## 2020-08-22 DIAGNOSIS — F0390 Unspecified dementia without behavioral disturbance: Secondary | ICD-10-CM | POA: Diagnosis not present

## 2020-08-22 DIAGNOSIS — K5900 Constipation, unspecified: Secondary | ICD-10-CM | POA: Diagnosis not present

## 2020-08-22 DIAGNOSIS — N390 Urinary tract infection, site not specified: Secondary | ICD-10-CM | POA: Diagnosis not present

## 2020-08-22 DIAGNOSIS — Z1612 Extended spectrum beta lactamase (ESBL) resistance: Secondary | ICD-10-CM | POA: Diagnosis not present

## 2020-08-22 DIAGNOSIS — Z1159 Encounter for screening for other viral diseases: Secondary | ICD-10-CM | POA: Diagnosis not present

## 2020-08-22 DIAGNOSIS — E785 Hyperlipidemia, unspecified: Secondary | ICD-10-CM | POA: Diagnosis not present

## 2020-08-22 DIAGNOSIS — I69391 Dysphagia following cerebral infarction: Secondary | ICD-10-CM | POA: Diagnosis not present

## 2020-08-22 DIAGNOSIS — K219 Gastro-esophageal reflux disease without esophagitis: Secondary | ICD-10-CM | POA: Diagnosis not present

## 2020-08-22 DIAGNOSIS — G9341 Metabolic encephalopathy: Secondary | ICD-10-CM | POA: Diagnosis not present

## 2020-08-22 DIAGNOSIS — Z8673 Personal history of transient ischemic attack (TIA), and cerebral infarction without residual deficits: Secondary | ICD-10-CM | POA: Diagnosis not present

## 2020-08-22 DIAGNOSIS — M6281 Muscle weakness (generalized): Secondary | ICD-10-CM | POA: Diagnosis not present

## 2020-08-22 DIAGNOSIS — B9561 Methicillin susceptible Staphylococcus aureus infection as the cause of diseases classified elsewhere: Secondary | ICD-10-CM | POA: Diagnosis not present

## 2020-08-22 DIAGNOSIS — R1312 Dysphagia, oropharyngeal phase: Secondary | ICD-10-CM | POA: Diagnosis not present

## 2020-08-22 DIAGNOSIS — F0391 Unspecified dementia with behavioral disturbance: Secondary | ICD-10-CM | POA: Diagnosis not present

## 2020-08-22 DIAGNOSIS — J189 Pneumonia, unspecified organism: Secondary | ICD-10-CM | POA: Diagnosis not present

## 2020-08-22 DIAGNOSIS — I2510 Atherosclerotic heart disease of native coronary artery without angina pectoris: Secondary | ICD-10-CM | POA: Diagnosis not present

## 2020-08-22 DIAGNOSIS — Z23 Encounter for immunization: Secondary | ICD-10-CM | POA: Diagnosis not present

## 2020-08-22 DIAGNOSIS — A4151 Sepsis due to Escherichia coli [E. coli]: Secondary | ICD-10-CM | POA: Diagnosis not present

## 2020-08-22 DIAGNOSIS — A419 Sepsis, unspecified organism: Secondary | ICD-10-CM | POA: Diagnosis not present

## 2020-08-22 DIAGNOSIS — E1140 Type 2 diabetes mellitus with diabetic neuropathy, unspecified: Secondary | ICD-10-CM | POA: Diagnosis not present

## 2020-08-22 DIAGNOSIS — A499 Bacterial infection, unspecified: Secondary | ICD-10-CM | POA: Diagnosis not present

## 2020-08-22 DIAGNOSIS — I69351 Hemiplegia and hemiparesis following cerebral infarction affecting right dominant side: Secondary | ICD-10-CM | POA: Diagnosis not present

## 2020-08-22 DIAGNOSIS — I10 Essential (primary) hypertension: Secondary | ICD-10-CM | POA: Diagnosis not present

## 2020-08-22 DIAGNOSIS — I5032 Chronic diastolic (congestive) heart failure: Secondary | ICD-10-CM | POA: Diagnosis not present

## 2020-08-22 DIAGNOSIS — N183 Chronic kidney disease, stage 3 unspecified: Secondary | ICD-10-CM | POA: Diagnosis not present

## 2020-08-22 DIAGNOSIS — R1310 Dysphagia, unspecified: Secondary | ICD-10-CM | POA: Diagnosis not present

## 2020-08-22 DIAGNOSIS — I639 Cerebral infarction, unspecified: Secondary | ICD-10-CM | POA: Diagnosis not present

## 2020-08-22 DIAGNOSIS — R41841 Cognitive communication deficit: Secondary | ICD-10-CM | POA: Diagnosis not present

## 2020-08-22 DIAGNOSIS — G309 Alzheimer's disease, unspecified: Secondary | ICD-10-CM | POA: Diagnosis not present

## 2020-08-22 DIAGNOSIS — E119 Type 2 diabetes mellitus without complications: Secondary | ICD-10-CM | POA: Diagnosis not present

## 2020-08-22 DIAGNOSIS — G4089 Other seizures: Secondary | ICD-10-CM | POA: Diagnosis not present

## 2020-08-22 DIAGNOSIS — E039 Hypothyroidism, unspecified: Secondary | ICD-10-CM | POA: Diagnosis not present

## 2020-08-22 DIAGNOSIS — R293 Abnormal posture: Secondary | ICD-10-CM | POA: Diagnosis not present

## 2020-08-23 DIAGNOSIS — J189 Pneumonia, unspecified organism: Secondary | ICD-10-CM | POA: Diagnosis not present

## 2020-08-23 DIAGNOSIS — Z1612 Extended spectrum beta lactamase (ESBL) resistance: Secondary | ICD-10-CM | POA: Diagnosis not present

## 2020-08-23 DIAGNOSIS — A419 Sepsis, unspecified organism: Secondary | ICD-10-CM | POA: Diagnosis not present

## 2020-08-23 DIAGNOSIS — A499 Bacterial infection, unspecified: Secondary | ICD-10-CM | POA: Diagnosis not present

## 2020-08-26 DIAGNOSIS — E119 Type 2 diabetes mellitus without complications: Secondary | ICD-10-CM | POA: Diagnosis not present

## 2020-08-26 DIAGNOSIS — A419 Sepsis, unspecified organism: Secondary | ICD-10-CM | POA: Diagnosis not present

## 2020-08-26 DIAGNOSIS — I10 Essential (primary) hypertension: Secondary | ICD-10-CM | POA: Diagnosis not present

## 2020-08-27 DIAGNOSIS — I10 Essential (primary) hypertension: Secondary | ICD-10-CM | POA: Diagnosis not present

## 2020-08-27 DIAGNOSIS — E119 Type 2 diabetes mellitus without complications: Secondary | ICD-10-CM | POA: Diagnosis not present

## 2020-08-28 DIAGNOSIS — G309 Alzheimer's disease, unspecified: Secondary | ICD-10-CM | POA: Diagnosis not present

## 2020-08-28 DIAGNOSIS — G9341 Metabolic encephalopathy: Secondary | ICD-10-CM | POA: Diagnosis not present

## 2020-08-28 DIAGNOSIS — M6281 Muscle weakness (generalized): Secondary | ICD-10-CM | POA: Diagnosis not present

## 2020-08-28 DIAGNOSIS — E119 Type 2 diabetes mellitus without complications: Secondary | ICD-10-CM | POA: Diagnosis not present

## 2020-08-28 DIAGNOSIS — N390 Urinary tract infection, site not specified: Secondary | ICD-10-CM | POA: Diagnosis not present

## 2020-08-28 DIAGNOSIS — F0280 Dementia in other diseases classified elsewhere without behavioral disturbance: Secondary | ICD-10-CM | POA: Diagnosis not present

## 2020-08-28 DIAGNOSIS — I2510 Atherosclerotic heart disease of native coronary artery without angina pectoris: Secondary | ICD-10-CM | POA: Diagnosis not present

## 2020-08-30 DIAGNOSIS — Z8673 Personal history of transient ischemic attack (TIA), and cerebral infarction without residual deficits: Secondary | ICD-10-CM | POA: Diagnosis not present

## 2020-08-30 DIAGNOSIS — E785 Hyperlipidemia, unspecified: Secondary | ICD-10-CM | POA: Diagnosis not present

## 2020-08-30 DIAGNOSIS — K5900 Constipation, unspecified: Secondary | ICD-10-CM | POA: Diagnosis not present

## 2020-08-30 DIAGNOSIS — G309 Alzheimer's disease, unspecified: Secondary | ICD-10-CM | POA: Diagnosis not present

## 2020-08-30 DIAGNOSIS — I10 Essential (primary) hypertension: Secondary | ICD-10-CM | POA: Diagnosis not present

## 2020-08-30 DIAGNOSIS — E039 Hypothyroidism, unspecified: Secondary | ICD-10-CM | POA: Diagnosis not present

## 2020-08-30 DIAGNOSIS — F0280 Dementia in other diseases classified elsewhere without behavioral disturbance: Secondary | ICD-10-CM | POA: Diagnosis not present

## 2020-09-02 DIAGNOSIS — F0390 Unspecified dementia without behavioral disturbance: Secondary | ICD-10-CM | POA: Diagnosis not present

## 2020-09-02 DIAGNOSIS — E119 Type 2 diabetes mellitus without complications: Secondary | ICD-10-CM | POA: Diagnosis not present

## 2020-09-02 DIAGNOSIS — J189 Pneumonia, unspecified organism: Secondary | ICD-10-CM | POA: Diagnosis not present

## 2020-09-02 DIAGNOSIS — I10 Essential (primary) hypertension: Secondary | ICD-10-CM | POA: Diagnosis not present

## 2020-09-02 DIAGNOSIS — E039 Hypothyroidism, unspecified: Secondary | ICD-10-CM | POA: Diagnosis not present

## 2020-09-04 DIAGNOSIS — E119 Type 2 diabetes mellitus without complications: Secondary | ICD-10-CM | POA: Diagnosis not present

## 2020-09-04 DIAGNOSIS — E039 Hypothyroidism, unspecified: Secondary | ICD-10-CM | POA: Diagnosis not present

## 2020-09-04 DIAGNOSIS — I10 Essential (primary) hypertension: Secondary | ICD-10-CM | POA: Diagnosis not present

## 2020-09-04 DIAGNOSIS — F0280 Dementia in other diseases classified elsewhere without behavioral disturbance: Secondary | ICD-10-CM | POA: Diagnosis not present

## 2020-09-04 DIAGNOSIS — K5900 Constipation, unspecified: Secondary | ICD-10-CM | POA: Diagnosis not present

## 2020-09-04 DIAGNOSIS — I5030 Unspecified diastolic (congestive) heart failure: Secondary | ICD-10-CM | POA: Diagnosis not present

## 2020-09-04 DIAGNOSIS — I63449 Cerebral infarction due to embolism of unspecified cerebellar artery: Secondary | ICD-10-CM | POA: Diagnosis not present

## 2020-09-04 DIAGNOSIS — F0390 Unspecified dementia without behavioral disturbance: Secondary | ICD-10-CM | POA: Diagnosis not present

## 2020-09-04 DIAGNOSIS — G309 Alzheimer's disease, unspecified: Secondary | ICD-10-CM | POA: Diagnosis not present

## 2020-09-05 DIAGNOSIS — R739 Hyperglycemia, unspecified: Secondary | ICD-10-CM | POA: Diagnosis not present

## 2020-09-06 DIAGNOSIS — F0390 Unspecified dementia without behavioral disturbance: Secondary | ICD-10-CM | POA: Diagnosis not present

## 2020-09-06 DIAGNOSIS — M6281 Muscle weakness (generalized): Secondary | ICD-10-CM | POA: Diagnosis not present

## 2020-09-06 DIAGNOSIS — E785 Hyperlipidemia, unspecified: Secondary | ICD-10-CM | POA: Diagnosis not present

## 2020-09-06 DIAGNOSIS — I10 Essential (primary) hypertension: Secondary | ICD-10-CM | POA: Diagnosis not present

## 2020-09-06 DIAGNOSIS — R609 Edema, unspecified: Secondary | ICD-10-CM | POA: Diagnosis not present

## 2020-09-06 DIAGNOSIS — K5900 Constipation, unspecified: Secondary | ICD-10-CM | POA: Diagnosis not present

## 2020-09-06 DIAGNOSIS — E119 Type 2 diabetes mellitus without complications: Secondary | ICD-10-CM | POA: Diagnosis not present

## 2020-09-06 DIAGNOSIS — K219 Gastro-esophageal reflux disease without esophagitis: Secondary | ICD-10-CM | POA: Diagnosis not present

## 2020-09-06 DIAGNOSIS — I63449 Cerebral infarction due to embolism of unspecified cerebellar artery: Secondary | ICD-10-CM | POA: Diagnosis not present

## 2020-09-06 DIAGNOSIS — G309 Alzheimer's disease, unspecified: Secondary | ICD-10-CM | POA: Diagnosis not present

## 2020-09-06 DIAGNOSIS — E039 Hypothyroidism, unspecified: Secondary | ICD-10-CM | POA: Diagnosis not present

## 2020-09-06 DIAGNOSIS — E1140 Type 2 diabetes mellitus with diabetic neuropathy, unspecified: Secondary | ICD-10-CM | POA: Diagnosis not present

## 2020-09-09 DIAGNOSIS — M109 Gout, unspecified: Secondary | ICD-10-CM | POA: Diagnosis not present

## 2020-09-09 DIAGNOSIS — E039 Hypothyroidism, unspecified: Secondary | ICD-10-CM | POA: Diagnosis not present

## 2020-09-09 DIAGNOSIS — F0390 Unspecified dementia without behavioral disturbance: Secondary | ICD-10-CM | POA: Diagnosis not present

## 2020-09-09 DIAGNOSIS — I10 Essential (primary) hypertension: Secondary | ICD-10-CM | POA: Diagnosis not present

## 2020-09-09 DIAGNOSIS — R41841 Cognitive communication deficit: Secondary | ICD-10-CM | POA: Diagnosis not present

## 2020-09-09 DIAGNOSIS — K219 Gastro-esophageal reflux disease without esophagitis: Secondary | ICD-10-CM | POA: Diagnosis not present

## 2020-09-09 DIAGNOSIS — M6281 Muscle weakness (generalized): Secondary | ICD-10-CM | POA: Diagnosis not present

## 2020-09-09 DIAGNOSIS — E119 Type 2 diabetes mellitus without complications: Secondary | ICD-10-CM | POA: Diagnosis not present

## 2020-09-09 DIAGNOSIS — E1140 Type 2 diabetes mellitus with diabetic neuropathy, unspecified: Secondary | ICD-10-CM | POA: Diagnosis not present

## 2020-09-09 DIAGNOSIS — E785 Hyperlipidemia, unspecified: Secondary | ICD-10-CM | POA: Diagnosis not present

## 2020-09-09 DIAGNOSIS — I5030 Unspecified diastolic (congestive) heart failure: Secondary | ICD-10-CM | POA: Diagnosis not present

## 2020-09-11 DIAGNOSIS — R41841 Cognitive communication deficit: Secondary | ICD-10-CM | POA: Diagnosis not present

## 2020-09-11 DIAGNOSIS — K219 Gastro-esophageal reflux disease without esophagitis: Secondary | ICD-10-CM | POA: Diagnosis not present

## 2020-09-11 DIAGNOSIS — I10 Essential (primary) hypertension: Secondary | ICD-10-CM | POA: Diagnosis not present

## 2020-09-11 DIAGNOSIS — M6281 Muscle weakness (generalized): Secondary | ICD-10-CM | POA: Diagnosis not present

## 2020-09-11 DIAGNOSIS — E785 Hyperlipidemia, unspecified: Secondary | ICD-10-CM | POA: Diagnosis not present

## 2020-09-11 DIAGNOSIS — E119 Type 2 diabetes mellitus without complications: Secondary | ICD-10-CM | POA: Diagnosis not present

## 2020-09-11 DIAGNOSIS — K5900 Constipation, unspecified: Secondary | ICD-10-CM | POA: Diagnosis not present

## 2020-09-11 DIAGNOSIS — F0390 Unspecified dementia without behavioral disturbance: Secondary | ICD-10-CM | POA: Diagnosis not present

## 2020-09-11 DIAGNOSIS — E039 Hypothyroidism, unspecified: Secondary | ICD-10-CM | POA: Diagnosis not present

## 2020-09-11 DIAGNOSIS — I5030 Unspecified diastolic (congestive) heart failure: Secondary | ICD-10-CM | POA: Diagnosis not present

## 2020-09-11 DIAGNOSIS — I63449 Cerebral infarction due to embolism of unspecified cerebellar artery: Secondary | ICD-10-CM | POA: Diagnosis not present

## 2020-09-13 DIAGNOSIS — F0390 Unspecified dementia without behavioral disturbance: Secondary | ICD-10-CM | POA: Diagnosis not present

## 2020-09-13 DIAGNOSIS — E785 Hyperlipidemia, unspecified: Secondary | ICD-10-CM | POA: Diagnosis not present

## 2020-09-13 DIAGNOSIS — I10 Essential (primary) hypertension: Secondary | ICD-10-CM | POA: Diagnosis not present

## 2020-09-13 DIAGNOSIS — M6281 Muscle weakness (generalized): Secondary | ICD-10-CM | POA: Diagnosis not present

## 2020-09-13 DIAGNOSIS — G309 Alzheimer's disease, unspecified: Secondary | ICD-10-CM | POA: Diagnosis not present

## 2020-09-13 DIAGNOSIS — E119 Type 2 diabetes mellitus without complications: Secondary | ICD-10-CM | POA: Diagnosis not present

## 2020-09-13 DIAGNOSIS — E039 Hypothyroidism, unspecified: Secondary | ICD-10-CM | POA: Diagnosis not present

## 2020-09-13 DIAGNOSIS — I63449 Cerebral infarction due to embolism of unspecified cerebellar artery: Secondary | ICD-10-CM | POA: Diagnosis not present

## 2020-09-16 DIAGNOSIS — E039 Hypothyroidism, unspecified: Secondary | ICD-10-CM | POA: Diagnosis not present

## 2020-09-16 DIAGNOSIS — E785 Hyperlipidemia, unspecified: Secondary | ICD-10-CM | POA: Diagnosis not present

## 2020-09-16 DIAGNOSIS — E119 Type 2 diabetes mellitus without complications: Secondary | ICD-10-CM | POA: Diagnosis not present

## 2020-09-16 DIAGNOSIS — K219 Gastro-esophageal reflux disease without esophagitis: Secondary | ICD-10-CM | POA: Diagnosis not present

## 2020-09-16 DIAGNOSIS — I63449 Cerebral infarction due to embolism of unspecified cerebellar artery: Secondary | ICD-10-CM | POA: Diagnosis not present

## 2020-09-16 DIAGNOSIS — I10 Essential (primary) hypertension: Secondary | ICD-10-CM | POA: Diagnosis not present

## 2020-09-16 DIAGNOSIS — R41841 Cognitive communication deficit: Secondary | ICD-10-CM | POA: Diagnosis not present

## 2020-09-16 DIAGNOSIS — G309 Alzheimer's disease, unspecified: Secondary | ICD-10-CM | POA: Diagnosis not present

## 2020-09-16 DIAGNOSIS — M6281 Muscle weakness (generalized): Secondary | ICD-10-CM | POA: Diagnosis not present

## 2020-09-16 DIAGNOSIS — E1140 Type 2 diabetes mellitus with diabetic neuropathy, unspecified: Secondary | ICD-10-CM | POA: Diagnosis not present

## 2020-09-16 DIAGNOSIS — I5030 Unspecified diastolic (congestive) heart failure: Secondary | ICD-10-CM | POA: Diagnosis not present

## 2020-09-17 DIAGNOSIS — M436 Torticollis: Secondary | ICD-10-CM | POA: Diagnosis not present

## 2020-09-17 DIAGNOSIS — R5383 Other fatigue: Secondary | ICD-10-CM | POA: Diagnosis not present

## 2020-09-18 DIAGNOSIS — N390 Urinary tract infection, site not specified: Secondary | ICD-10-CM | POA: Diagnosis not present

## 2020-09-18 DIAGNOSIS — E119 Type 2 diabetes mellitus without complications: Secondary | ICD-10-CM | POA: Diagnosis not present

## 2020-09-19 DIAGNOSIS — N390 Urinary tract infection, site not specified: Secondary | ICD-10-CM | POA: Diagnosis not present

## 2020-09-20 DIAGNOSIS — N390 Urinary tract infection, site not specified: Secondary | ICD-10-CM | POA: Diagnosis not present

## 2020-09-23 DIAGNOSIS — I5030 Unspecified diastolic (congestive) heart failure: Secondary | ICD-10-CM | POA: Diagnosis not present

## 2020-09-23 DIAGNOSIS — E119 Type 2 diabetes mellitus without complications: Secondary | ICD-10-CM | POA: Diagnosis not present

## 2020-09-23 DIAGNOSIS — E785 Hyperlipidemia, unspecified: Secondary | ICD-10-CM | POA: Diagnosis not present

## 2020-09-23 DIAGNOSIS — M6281 Muscle weakness (generalized): Secondary | ICD-10-CM | POA: Diagnosis not present

## 2020-09-23 DIAGNOSIS — I10 Essential (primary) hypertension: Secondary | ICD-10-CM | POA: Diagnosis not present

## 2020-09-23 DIAGNOSIS — K219 Gastro-esophageal reflux disease without esophagitis: Secondary | ICD-10-CM | POA: Diagnosis not present

## 2020-09-23 DIAGNOSIS — R41841 Cognitive communication deficit: Secondary | ICD-10-CM | POA: Diagnosis not present

## 2020-09-23 DIAGNOSIS — F0390 Unspecified dementia without behavioral disturbance: Secondary | ICD-10-CM | POA: Diagnosis not present

## 2020-09-23 DIAGNOSIS — E039 Hypothyroidism, unspecified: Secondary | ICD-10-CM | POA: Diagnosis not present

## 2020-09-23 DIAGNOSIS — I63449 Cerebral infarction due to embolism of unspecified cerebellar artery: Secondary | ICD-10-CM | POA: Diagnosis not present

## 2020-09-23 DIAGNOSIS — G309 Alzheimer's disease, unspecified: Secondary | ICD-10-CM | POA: Diagnosis not present

## 2020-09-23 DIAGNOSIS — E1140 Type 2 diabetes mellitus with diabetic neuropathy, unspecified: Secondary | ICD-10-CM | POA: Diagnosis not present

## 2020-09-24 DIAGNOSIS — E119 Type 2 diabetes mellitus without complications: Secondary | ICD-10-CM | POA: Diagnosis not present

## 2020-09-24 DIAGNOSIS — F0390 Unspecified dementia without behavioral disturbance: Secondary | ICD-10-CM | POA: Diagnosis not present

## 2020-09-24 DIAGNOSIS — N390 Urinary tract infection, site not specified: Secondary | ICD-10-CM | POA: Diagnosis not present

## 2020-09-24 DIAGNOSIS — I10 Essential (primary) hypertension: Secondary | ICD-10-CM | POA: Diagnosis not present

## 2020-10-07 DIAGNOSIS — R1110 Vomiting, unspecified: Secondary | ICD-10-CM | POA: Diagnosis not present

## 2020-10-10 DIAGNOSIS — H109 Unspecified conjunctivitis: Secondary | ICD-10-CM | POA: Diagnosis not present

## 2020-10-10 DIAGNOSIS — E119 Type 2 diabetes mellitus without complications: Secondary | ICD-10-CM | POA: Diagnosis not present

## 2020-10-12 DIAGNOSIS — I340 Nonrheumatic mitral (valve) insufficiency: Secondary | ICD-10-CM | POA: Diagnosis not present

## 2020-10-12 DIAGNOSIS — E1142 Type 2 diabetes mellitus with diabetic polyneuropathy: Secondary | ICD-10-CM | POA: Diagnosis present

## 2020-10-12 DIAGNOSIS — Z23 Encounter for immunization: Secondary | ICD-10-CM | POA: Diagnosis not present

## 2020-10-12 DIAGNOSIS — Z20822 Contact with and (suspected) exposure to covid-19: Secondary | ICD-10-CM | POA: Diagnosis present

## 2020-10-12 DIAGNOSIS — Z7902 Long term (current) use of antithrombotics/antiplatelets: Secondary | ICD-10-CM | POA: Diagnosis not present

## 2020-10-12 DIAGNOSIS — M109 Gout, unspecified: Secondary | ICD-10-CM | POA: Diagnosis not present

## 2020-10-12 DIAGNOSIS — N390 Urinary tract infection, site not specified: Secondary | ICD-10-CM | POA: Diagnosis not present

## 2020-10-12 DIAGNOSIS — Z882 Allergy status to sulfonamides status: Secondary | ICD-10-CM | POA: Diagnosis not present

## 2020-10-12 DIAGNOSIS — G4089 Other seizures: Secondary | ICD-10-CM | POA: Diagnosis not present

## 2020-10-12 DIAGNOSIS — R29818 Other symptoms and signs involving the nervous system: Secondary | ICD-10-CM | POA: Diagnosis not present

## 2020-10-12 DIAGNOSIS — R471 Dysarthria and anarthria: Secondary | ICD-10-CM | POA: Diagnosis not present

## 2020-10-12 DIAGNOSIS — M6281 Muscle weakness (generalized): Secondary | ICD-10-CM | POA: Diagnosis not present

## 2020-10-12 DIAGNOSIS — R4781 Slurred speech: Secondary | ICD-10-CM | POA: Diagnosis not present

## 2020-10-12 DIAGNOSIS — Z91048 Other nonmedicinal substance allergy status: Secondary | ICD-10-CM | POA: Diagnosis not present

## 2020-10-12 DIAGNOSIS — I5030 Unspecified diastolic (congestive) heart failure: Secondary | ICD-10-CM | POA: Diagnosis not present

## 2020-10-12 DIAGNOSIS — E039 Hypothyroidism, unspecified: Secondary | ICD-10-CM | POA: Diagnosis not present

## 2020-10-12 DIAGNOSIS — E538 Deficiency of other specified B group vitamins: Secondary | ICD-10-CM | POA: Diagnosis not present

## 2020-10-12 DIAGNOSIS — I69320 Aphasia following cerebral infarction: Secondary | ICD-10-CM | POA: Diagnosis not present

## 2020-10-12 DIAGNOSIS — Z794 Long term (current) use of insulin: Secondary | ICD-10-CM | POA: Diagnosis not present

## 2020-10-12 DIAGNOSIS — I10 Essential (primary) hypertension: Secondary | ICD-10-CM | POA: Diagnosis not present

## 2020-10-12 DIAGNOSIS — E1140 Type 2 diabetes mellitus with diabetic neuropathy, unspecified: Secondary | ICD-10-CM | POA: Diagnosis not present

## 2020-10-12 DIAGNOSIS — R29810 Facial weakness: Secondary | ICD-10-CM | POA: Diagnosis not present

## 2020-10-12 DIAGNOSIS — G9341 Metabolic encephalopathy: Secondary | ICD-10-CM | POA: Diagnosis not present

## 2020-10-12 DIAGNOSIS — N183 Chronic kidney disease, stage 3 unspecified: Secondary | ICD-10-CM | POA: Diagnosis not present

## 2020-10-12 DIAGNOSIS — E119 Type 2 diabetes mellitus without complications: Secondary | ICD-10-CM | POA: Diagnosis not present

## 2020-10-12 DIAGNOSIS — R4701 Aphasia: Secondary | ICD-10-CM | POA: Diagnosis not present

## 2020-10-12 DIAGNOSIS — R0989 Other specified symptoms and signs involving the circulatory and respiratory systems: Secondary | ICD-10-CM | POA: Diagnosis not present

## 2020-10-12 DIAGNOSIS — G309 Alzheimer's disease, unspecified: Secondary | ICD-10-CM | POA: Diagnosis not present

## 2020-10-12 DIAGNOSIS — I63449 Cerebral infarction due to embolism of unspecified cerebellar artery: Secondary | ICD-10-CM | POA: Diagnosis not present

## 2020-10-12 DIAGNOSIS — Z8673 Personal history of transient ischemic attack (TIA), and cerebral infarction without residual deficits: Secondary | ICD-10-CM | POA: Diagnosis not present

## 2020-10-12 DIAGNOSIS — G459 Transient cerebral ischemic attack, unspecified: Secondary | ICD-10-CM | POA: Diagnosis not present

## 2020-10-12 DIAGNOSIS — I110 Hypertensive heart disease with heart failure: Secondary | ICD-10-CM | POA: Diagnosis present

## 2020-10-12 DIAGNOSIS — I5032 Chronic diastolic (congestive) heart failure: Secondary | ICD-10-CM | POA: Diagnosis not present

## 2020-10-12 DIAGNOSIS — H109 Unspecified conjunctivitis: Secondary | ICD-10-CM | POA: Diagnosis not present

## 2020-10-12 DIAGNOSIS — I2510 Atherosclerotic heart disease of native coronary artery without angina pectoris: Secondary | ICD-10-CM | POA: Diagnosis not present

## 2020-10-12 DIAGNOSIS — E785 Hyperlipidemia, unspecified: Secondary | ICD-10-CM | POA: Diagnosis present

## 2020-10-12 DIAGNOSIS — Z7984 Long term (current) use of oral hypoglycemic drugs: Secondary | ICD-10-CM | POA: Diagnosis not present

## 2020-10-12 DIAGNOSIS — Z7401 Bed confinement status: Secondary | ICD-10-CM | POA: Diagnosis not present

## 2020-10-12 DIAGNOSIS — Z7982 Long term (current) use of aspirin: Secondary | ICD-10-CM | POA: Diagnosis not present

## 2020-10-12 DIAGNOSIS — Z1159 Encounter for screening for other viral diseases: Secondary | ICD-10-CM | POA: Diagnosis not present

## 2020-10-12 DIAGNOSIS — K219 Gastro-esophageal reflux disease without esophagitis: Secondary | ICD-10-CM | POA: Diagnosis present

## 2020-10-12 DIAGNOSIS — R1311 Dysphagia, oral phase: Secondary | ICD-10-CM | POA: Diagnosis not present

## 2020-10-12 DIAGNOSIS — I69351 Hemiplegia and hemiparesis following cerebral infarction affecting right dominant side: Secondary | ICD-10-CM | POA: Diagnosis not present

## 2020-10-12 DIAGNOSIS — R29707 NIHSS score 7: Secondary | ICD-10-CM | POA: Diagnosis present

## 2020-10-12 DIAGNOSIS — F0390 Unspecified dementia without behavioral disturbance: Secondary | ICD-10-CM | POA: Diagnosis not present

## 2020-10-12 DIAGNOSIS — I6789 Other cerebrovascular disease: Secondary | ICD-10-CM | POA: Diagnosis not present

## 2020-10-12 DIAGNOSIS — K5900 Constipation, unspecified: Secondary | ICD-10-CM | POA: Diagnosis not present

## 2020-10-12 DIAGNOSIS — D519 Vitamin B12 deficiency anemia, unspecified: Secondary | ICD-10-CM | POA: Diagnosis not present

## 2020-10-12 DIAGNOSIS — Z79899 Other long term (current) drug therapy: Secondary | ICD-10-CM | POA: Diagnosis not present

## 2020-10-12 DIAGNOSIS — Z87440 Personal history of urinary (tract) infections: Secondary | ICD-10-CM | POA: Diagnosis not present

## 2020-10-12 DIAGNOSIS — I639 Cerebral infarction, unspecified: Secondary | ICD-10-CM | POA: Diagnosis present

## 2020-10-12 DIAGNOSIS — I69391 Dysphagia following cerebral infarction: Secondary | ICD-10-CM | POA: Diagnosis not present

## 2020-10-12 DIAGNOSIS — R41841 Cognitive communication deficit: Secondary | ICD-10-CM | POA: Diagnosis not present

## 2020-10-14 DIAGNOSIS — G4089 Other seizures: Secondary | ICD-10-CM | POA: Diagnosis not present

## 2020-10-14 DIAGNOSIS — I5030 Unspecified diastolic (congestive) heart failure: Secondary | ICD-10-CM | POA: Diagnosis not present

## 2020-10-14 DIAGNOSIS — K5900 Constipation, unspecified: Secondary | ICD-10-CM | POA: Diagnosis not present

## 2020-10-14 DIAGNOSIS — G309 Alzheimer's disease, unspecified: Secondary | ICD-10-CM | POA: Diagnosis not present

## 2020-10-14 DIAGNOSIS — H109 Unspecified conjunctivitis: Secondary | ICD-10-CM | POA: Diagnosis not present

## 2020-10-14 DIAGNOSIS — G459 Transient cerebral ischemic attack, unspecified: Secondary | ICD-10-CM | POA: Diagnosis not present

## 2020-10-14 DIAGNOSIS — E039 Hypothyroidism, unspecified: Secondary | ICD-10-CM | POA: Diagnosis not present

## 2020-10-14 DIAGNOSIS — I10 Essential (primary) hypertension: Secondary | ICD-10-CM | POA: Diagnosis not present

## 2020-10-14 DIAGNOSIS — E119 Type 2 diabetes mellitus without complications: Secondary | ICD-10-CM | POA: Diagnosis not present

## 2020-10-14 DIAGNOSIS — F0390 Unspecified dementia without behavioral disturbance: Secondary | ICD-10-CM | POA: Diagnosis not present

## 2020-10-14 DIAGNOSIS — I63449 Cerebral infarction due to embolism of unspecified cerebellar artery: Secondary | ICD-10-CM | POA: Diagnosis not present

## 2020-10-14 DIAGNOSIS — M109 Gout, unspecified: Secondary | ICD-10-CM | POA: Diagnosis not present

## 2020-10-16 DIAGNOSIS — N390 Urinary tract infection, site not specified: Secondary | ICD-10-CM | POA: Diagnosis not present

## 2020-10-16 DIAGNOSIS — G4089 Other seizures: Secondary | ICD-10-CM | POA: Diagnosis not present

## 2020-10-16 DIAGNOSIS — E1139 Type 2 diabetes mellitus with other diabetic ophthalmic complication: Secondary | ICD-10-CM | POA: Diagnosis not present

## 2020-10-16 DIAGNOSIS — I2510 Atherosclerotic heart disease of native coronary artery without angina pectoris: Secondary | ICD-10-CM | POA: Diagnosis not present

## 2020-10-16 DIAGNOSIS — Z23 Encounter for immunization: Secondary | ICD-10-CM | POA: Diagnosis not present

## 2020-10-16 DIAGNOSIS — H0288B Meibomian gland dysfunction left eye, upper and lower eyelids: Secondary | ICD-10-CM | POA: Diagnosis not present

## 2020-10-16 DIAGNOSIS — D519 Vitamin B12 deficiency anemia, unspecified: Secondary | ICD-10-CM | POA: Diagnosis not present

## 2020-10-16 DIAGNOSIS — Z7401 Bed confinement status: Secondary | ICD-10-CM | POA: Diagnosis not present

## 2020-10-16 DIAGNOSIS — I69320 Aphasia following cerebral infarction: Secondary | ICD-10-CM | POA: Diagnosis not present

## 2020-10-16 DIAGNOSIS — K5900 Constipation, unspecified: Secondary | ICD-10-CM | POA: Diagnosis not present

## 2020-10-16 DIAGNOSIS — G309 Alzheimer's disease, unspecified: Secondary | ICD-10-CM | POA: Diagnosis not present

## 2020-10-16 DIAGNOSIS — E1140 Type 2 diabetes mellitus with diabetic neuropathy, unspecified: Secondary | ICD-10-CM | POA: Diagnosis not present

## 2020-10-16 DIAGNOSIS — Z8673 Personal history of transient ischemic attack (TIA), and cerebral infarction without residual deficits: Secondary | ICD-10-CM | POA: Diagnosis not present

## 2020-10-16 DIAGNOSIS — F0280 Dementia in other diseases classified elsewhere without behavioral disturbance: Secondary | ICD-10-CM | POA: Diagnosis not present

## 2020-10-16 DIAGNOSIS — K219 Gastro-esophageal reflux disease without esophagitis: Secondary | ICD-10-CM | POA: Diagnosis not present

## 2020-10-16 DIAGNOSIS — Z1159 Encounter for screening for other viral diseases: Secondary | ICD-10-CM | POA: Diagnosis not present

## 2020-10-16 DIAGNOSIS — R41841 Cognitive communication deficit: Secondary | ICD-10-CM | POA: Diagnosis not present

## 2020-10-16 DIAGNOSIS — R471 Dysarthria and anarthria: Secondary | ICD-10-CM | POA: Diagnosis not present

## 2020-10-16 DIAGNOSIS — I5030 Unspecified diastolic (congestive) heart failure: Secondary | ICD-10-CM | POA: Diagnosis not present

## 2020-10-16 DIAGNOSIS — I639 Cerebral infarction, unspecified: Secondary | ICD-10-CM | POA: Diagnosis not present

## 2020-10-16 DIAGNOSIS — G459 Transient cerebral ischemic attack, unspecified: Secondary | ICD-10-CM | POA: Diagnosis not present

## 2020-10-16 DIAGNOSIS — R4701 Aphasia: Secondary | ICD-10-CM | POA: Diagnosis not present

## 2020-10-16 DIAGNOSIS — R300 Dysuria: Secondary | ICD-10-CM | POA: Diagnosis not present

## 2020-10-16 DIAGNOSIS — Z79899 Other long term (current) drug therapy: Secondary | ICD-10-CM | POA: Diagnosis not present

## 2020-10-16 DIAGNOSIS — F0390 Unspecified dementia without behavioral disturbance: Secondary | ICD-10-CM | POA: Diagnosis not present

## 2020-10-16 DIAGNOSIS — I5032 Chronic diastolic (congestive) heart failure: Secondary | ICD-10-CM | POA: Diagnosis not present

## 2020-10-16 DIAGNOSIS — I69351 Hemiplegia and hemiparesis following cerebral infarction affecting right dominant side: Secondary | ICD-10-CM | POA: Diagnosis not present

## 2020-10-16 DIAGNOSIS — E039 Hypothyroidism, unspecified: Secondary | ICD-10-CM | POA: Diagnosis not present

## 2020-10-16 DIAGNOSIS — M6281 Muscle weakness (generalized): Secondary | ICD-10-CM | POA: Diagnosis not present

## 2020-10-16 DIAGNOSIS — E785 Hyperlipidemia, unspecified: Secondary | ICD-10-CM | POA: Diagnosis not present

## 2020-10-16 DIAGNOSIS — I10 Essential (primary) hypertension: Secondary | ICD-10-CM | POA: Diagnosis not present

## 2020-10-16 DIAGNOSIS — I63449 Cerebral infarction due to embolism of unspecified cerebellar artery: Secondary | ICD-10-CM | POA: Diagnosis not present

## 2020-10-16 DIAGNOSIS — E119 Type 2 diabetes mellitus without complications: Secondary | ICD-10-CM | POA: Diagnosis not present

## 2020-10-16 DIAGNOSIS — H0288A Meibomian gland dysfunction right eye, upper and lower eyelids: Secondary | ICD-10-CM | POA: Diagnosis not present

## 2020-10-16 DIAGNOSIS — Z961 Presence of intraocular lens: Secondary | ICD-10-CM | POA: Diagnosis not present

## 2020-10-16 DIAGNOSIS — I69391 Dysphagia following cerebral infarction: Secondary | ICD-10-CM | POA: Diagnosis not present

## 2020-10-16 DIAGNOSIS — R1311 Dysphagia, oral phase: Secondary | ICD-10-CM | POA: Diagnosis not present

## 2020-10-16 DIAGNOSIS — G4734 Idiopathic sleep related nonobstructive alveolar hypoventilation: Secondary | ICD-10-CM | POA: Diagnosis not present

## 2020-10-16 DIAGNOSIS — R1310 Dysphagia, unspecified: Secondary | ICD-10-CM | POA: Diagnosis not present

## 2020-10-16 DIAGNOSIS — N183 Chronic kidney disease, stage 3 unspecified: Secondary | ICD-10-CM | POA: Diagnosis not present

## 2020-10-18 DIAGNOSIS — K5900 Constipation, unspecified: Secondary | ICD-10-CM | POA: Diagnosis not present

## 2020-10-18 DIAGNOSIS — I10 Essential (primary) hypertension: Secondary | ICD-10-CM | POA: Diagnosis not present

## 2020-10-18 DIAGNOSIS — F0390 Unspecified dementia without behavioral disturbance: Secondary | ICD-10-CM | POA: Diagnosis not present

## 2020-10-18 DIAGNOSIS — G4089 Other seizures: Secondary | ICD-10-CM | POA: Diagnosis not present

## 2020-10-18 DIAGNOSIS — I63449 Cerebral infarction due to embolism of unspecified cerebellar artery: Secondary | ICD-10-CM | POA: Diagnosis not present

## 2020-10-18 DIAGNOSIS — G309 Alzheimer's disease, unspecified: Secondary | ICD-10-CM | POA: Diagnosis not present

## 2020-10-18 DIAGNOSIS — I5030 Unspecified diastolic (congestive) heart failure: Secondary | ICD-10-CM | POA: Diagnosis not present

## 2020-10-18 DIAGNOSIS — E039 Hypothyroidism, unspecified: Secondary | ICD-10-CM | POA: Diagnosis not present

## 2020-10-18 DIAGNOSIS — G459 Transient cerebral ischemic attack, unspecified: Secondary | ICD-10-CM | POA: Diagnosis not present

## 2020-10-21 DIAGNOSIS — I639 Cerebral infarction, unspecified: Secondary | ICD-10-CM | POA: Diagnosis not present

## 2020-10-21 DIAGNOSIS — G459 Transient cerebral ischemic attack, unspecified: Secondary | ICD-10-CM | POA: Diagnosis not present

## 2020-10-21 DIAGNOSIS — F0390 Unspecified dementia without behavioral disturbance: Secondary | ICD-10-CM | POA: Diagnosis not present

## 2020-10-25 DIAGNOSIS — E119 Type 2 diabetes mellitus without complications: Secondary | ICD-10-CM | POA: Diagnosis not present

## 2020-10-25 DIAGNOSIS — Z8673 Personal history of transient ischemic attack (TIA), and cerebral infarction without residual deficits: Secondary | ICD-10-CM | POA: Diagnosis not present

## 2020-10-25 DIAGNOSIS — I10 Essential (primary) hypertension: Secondary | ICD-10-CM | POA: Diagnosis not present

## 2020-10-29 DIAGNOSIS — M6281 Muscle weakness (generalized): Secondary | ICD-10-CM | POA: Diagnosis not present

## 2020-10-29 DIAGNOSIS — I10 Essential (primary) hypertension: Secondary | ICD-10-CM | POA: Diagnosis not present

## 2020-10-29 DIAGNOSIS — Z8673 Personal history of transient ischemic attack (TIA), and cerebral infarction without residual deficits: Secondary | ICD-10-CM | POA: Diagnosis not present

## 2020-10-29 DIAGNOSIS — E119 Type 2 diabetes mellitus without complications: Secondary | ICD-10-CM | POA: Diagnosis not present

## 2020-10-29 DIAGNOSIS — R300 Dysuria: Secondary | ICD-10-CM | POA: Diagnosis not present

## 2020-11-04 DIAGNOSIS — E1139 Type 2 diabetes mellitus with other diabetic ophthalmic complication: Secondary | ICD-10-CM | POA: Diagnosis not present

## 2020-11-04 DIAGNOSIS — Z961 Presence of intraocular lens: Secondary | ICD-10-CM | POA: Diagnosis not present

## 2020-11-04 DIAGNOSIS — H0288A Meibomian gland dysfunction right eye, upper and lower eyelids: Secondary | ICD-10-CM | POA: Diagnosis not present

## 2020-11-04 DIAGNOSIS — H0288B Meibomian gland dysfunction left eye, upper and lower eyelids: Secondary | ICD-10-CM | POA: Diagnosis not present

## 2020-11-05 DIAGNOSIS — Z8673 Personal history of transient ischemic attack (TIA), and cerebral infarction without residual deficits: Secondary | ICD-10-CM | POA: Diagnosis not present

## 2020-11-05 DIAGNOSIS — I10 Essential (primary) hypertension: Secondary | ICD-10-CM | POA: Diagnosis not present

## 2020-11-05 DIAGNOSIS — E119 Type 2 diabetes mellitus without complications: Secondary | ICD-10-CM | POA: Diagnosis not present

## 2020-11-05 DIAGNOSIS — I69351 Hemiplegia and hemiparesis following cerebral infarction affecting right dominant side: Secondary | ICD-10-CM | POA: Diagnosis not present

## 2020-11-05 DIAGNOSIS — I69320 Aphasia following cerebral infarction: Secondary | ICD-10-CM | POA: Diagnosis not present

## 2020-11-05 DIAGNOSIS — Z79899 Other long term (current) drug therapy: Secondary | ICD-10-CM | POA: Diagnosis not present

## 2020-11-07 DIAGNOSIS — I69320 Aphasia following cerebral infarction: Secondary | ICD-10-CM | POA: Diagnosis not present

## 2020-11-07 DIAGNOSIS — Z8673 Personal history of transient ischemic attack (TIA), and cerebral infarction without residual deficits: Secondary | ICD-10-CM | POA: Diagnosis not present

## 2020-11-07 DIAGNOSIS — I69351 Hemiplegia and hemiparesis following cerebral infarction affecting right dominant side: Secondary | ICD-10-CM | POA: Diagnosis not present

## 2020-11-07 DIAGNOSIS — E119 Type 2 diabetes mellitus without complications: Secondary | ICD-10-CM | POA: Diagnosis not present

## 2020-11-07 DIAGNOSIS — I10 Essential (primary) hypertension: Secondary | ICD-10-CM | POA: Diagnosis not present

## 2020-11-12 DIAGNOSIS — R1310 Dysphagia, unspecified: Secondary | ICD-10-CM | POA: Diagnosis not present

## 2020-11-12 DIAGNOSIS — F0280 Dementia in other diseases classified elsewhere without behavioral disturbance: Secondary | ICD-10-CM | POA: Diagnosis not present

## 2020-11-12 DIAGNOSIS — F0390 Unspecified dementia without behavioral disturbance: Secondary | ICD-10-CM | POA: Diagnosis not present

## 2020-11-12 DIAGNOSIS — M6281 Muscle weakness (generalized): Secondary | ICD-10-CM | POA: Diagnosis not present

## 2020-11-12 DIAGNOSIS — Z8673 Personal history of transient ischemic attack (TIA), and cerebral infarction without residual deficits: Secondary | ICD-10-CM | POA: Diagnosis not present

## 2020-11-12 DIAGNOSIS — G309 Alzheimer's disease, unspecified: Secondary | ICD-10-CM | POA: Diagnosis not present

## 2020-11-12 DIAGNOSIS — E039 Hypothyroidism, unspecified: Secondary | ICD-10-CM | POA: Diagnosis not present

## 2020-11-12 DIAGNOSIS — I69351 Hemiplegia and hemiparesis following cerebral infarction affecting right dominant side: Secondary | ICD-10-CM | POA: Diagnosis not present

## 2020-11-12 DIAGNOSIS — G4089 Other seizures: Secondary | ICD-10-CM | POA: Diagnosis not present

## 2020-11-12 DIAGNOSIS — I10 Essential (primary) hypertension: Secondary | ICD-10-CM | POA: Diagnosis not present

## 2020-11-12 DIAGNOSIS — E119 Type 2 diabetes mellitus without complications: Secondary | ICD-10-CM | POA: Diagnosis not present

## 2020-11-12 DIAGNOSIS — I69320 Aphasia following cerebral infarction: Secondary | ICD-10-CM | POA: Diagnosis not present

## 2020-11-13 DIAGNOSIS — I69351 Hemiplegia and hemiparesis following cerebral infarction affecting right dominant side: Secondary | ICD-10-CM | POA: Diagnosis not present

## 2020-11-13 DIAGNOSIS — R41841 Cognitive communication deficit: Secondary | ICD-10-CM | POA: Diagnosis not present

## 2020-11-13 DIAGNOSIS — G309 Alzheimer's disease, unspecified: Secondary | ICD-10-CM | POA: Diagnosis not present

## 2020-11-13 DIAGNOSIS — M6281 Muscle weakness (generalized): Secondary | ICD-10-CM | POA: Diagnosis not present

## 2020-11-13 DIAGNOSIS — I10 Essential (primary) hypertension: Secondary | ICD-10-CM | POA: Diagnosis not present

## 2020-11-13 DIAGNOSIS — F0280 Dementia in other diseases classified elsewhere without behavioral disturbance: Secondary | ICD-10-CM | POA: Diagnosis not present

## 2020-11-13 DIAGNOSIS — I69320 Aphasia following cerebral infarction: Secondary | ICD-10-CM | POA: Diagnosis not present

## 2020-11-13 DIAGNOSIS — R1310 Dysphagia, unspecified: Secondary | ICD-10-CM | POA: Diagnosis not present

## 2020-11-13 DIAGNOSIS — G4734 Idiopathic sleep related nonobstructive alveolar hypoventilation: Secondary | ICD-10-CM | POA: Diagnosis not present

## 2020-11-14 DIAGNOSIS — G4089 Other seizures: Secondary | ICD-10-CM | POA: Diagnosis not present

## 2020-11-14 DIAGNOSIS — E039 Hypothyroidism, unspecified: Secondary | ICD-10-CM | POA: Diagnosis not present

## 2020-11-14 DIAGNOSIS — I10 Essential (primary) hypertension: Secondary | ICD-10-CM | POA: Diagnosis not present

## 2020-11-14 DIAGNOSIS — F0280 Dementia in other diseases classified elsewhere without behavioral disturbance: Secondary | ICD-10-CM | POA: Diagnosis not present

## 2020-11-19 DIAGNOSIS — I69320 Aphasia following cerebral infarction: Secondary | ICD-10-CM | POA: Diagnosis not present

## 2020-11-19 DIAGNOSIS — F0390 Unspecified dementia without behavioral disturbance: Secondary | ICD-10-CM | POA: Diagnosis not present

## 2020-11-19 DIAGNOSIS — M6281 Muscle weakness (generalized): Secondary | ICD-10-CM | POA: Diagnosis not present

## 2020-11-19 DIAGNOSIS — I10 Essential (primary) hypertension: Secondary | ICD-10-CM | POA: Diagnosis not present

## 2020-11-19 DIAGNOSIS — I69351 Hemiplegia and hemiparesis following cerebral infarction affecting right dominant side: Secondary | ICD-10-CM | POA: Diagnosis not present

## 2020-11-19 DIAGNOSIS — E119 Type 2 diabetes mellitus without complications: Secondary | ICD-10-CM | POA: Diagnosis not present

## 2020-12-06 DIAGNOSIS — E119 Type 2 diabetes mellitus without complications: Secondary | ICD-10-CM | POA: Diagnosis not present

## 2020-12-09 DIAGNOSIS — F0390 Unspecified dementia without behavioral disturbance: Secondary | ICD-10-CM | POA: Diagnosis not present

## 2020-12-09 DIAGNOSIS — I10 Essential (primary) hypertension: Secondary | ICD-10-CM | POA: Diagnosis not present

## 2020-12-09 DIAGNOSIS — I69351 Hemiplegia and hemiparesis following cerebral infarction affecting right dominant side: Secondary | ICD-10-CM | POA: Diagnosis not present

## 2020-12-09 DIAGNOSIS — E119 Type 2 diabetes mellitus without complications: Secondary | ICD-10-CM | POA: Diagnosis not present

## 2020-12-09 DIAGNOSIS — I69320 Aphasia following cerebral infarction: Secondary | ICD-10-CM | POA: Diagnosis not present

## 2020-12-10 DIAGNOSIS — G309 Alzheimer's disease, unspecified: Secondary | ICD-10-CM | POA: Diagnosis not present

## 2020-12-10 DIAGNOSIS — N183 Chronic kidney disease, stage 3 unspecified: Secondary | ICD-10-CM | POA: Diagnosis not present

## 2020-12-10 DIAGNOSIS — I639 Cerebral infarction, unspecified: Secondary | ICD-10-CM | POA: Diagnosis not present

## 2020-12-10 DIAGNOSIS — I69351 Hemiplegia and hemiparesis following cerebral infarction affecting right dominant side: Secondary | ICD-10-CM | POA: Diagnosis not present

## 2020-12-10 DIAGNOSIS — E1140 Type 2 diabetes mellitus with diabetic neuropathy, unspecified: Secondary | ICD-10-CM | POA: Diagnosis not present

## 2020-12-10 DIAGNOSIS — B351 Tinea unguium: Secondary | ICD-10-CM | POA: Diagnosis not present

## 2020-12-16 DIAGNOSIS — E039 Hypothyroidism, unspecified: Secondary | ICD-10-CM | POA: Diagnosis not present

## 2020-12-30 DIAGNOSIS — I9589 Other hypotension: Secondary | ICD-10-CM | POA: Diagnosis not present

## 2020-12-30 DIAGNOSIS — Z79899 Other long term (current) drug therapy: Secondary | ICD-10-CM | POA: Diagnosis not present

## 2020-12-30 DIAGNOSIS — Z7984 Long term (current) use of oral hypoglycemic drugs: Secondary | ICD-10-CM | POA: Diagnosis not present

## 2020-12-30 DIAGNOSIS — J9601 Acute respiratory failure with hypoxia: Secondary | ICD-10-CM | POA: Diagnosis present

## 2020-12-30 DIAGNOSIS — Z1159 Encounter for screening for other viral diseases: Secondary | ICD-10-CM | POA: Diagnosis not present

## 2020-12-30 DIAGNOSIS — I69320 Aphasia following cerebral infarction: Secondary | ICD-10-CM | POA: Diagnosis not present

## 2020-12-30 DIAGNOSIS — Z7401 Bed confinement status: Secondary | ICD-10-CM | POA: Diagnosis not present

## 2020-12-30 DIAGNOSIS — Z1612 Extended spectrum beta lactamase (ESBL) resistance: Secondary | ICD-10-CM | POA: Diagnosis present

## 2020-12-30 DIAGNOSIS — G309 Alzheimer's disease, unspecified: Secondary | ICD-10-CM | POA: Diagnosis not present

## 2020-12-30 DIAGNOSIS — I69392 Facial weakness following cerebral infarction: Secondary | ICD-10-CM | POA: Diagnosis not present

## 2020-12-30 DIAGNOSIS — N189 Chronic kidney disease, unspecified: Secondary | ICD-10-CM | POA: Diagnosis not present

## 2020-12-30 DIAGNOSIS — N950 Postmenopausal bleeding: Secondary | ICD-10-CM | POA: Diagnosis present

## 2020-12-30 DIAGNOSIS — K219 Gastro-esophageal reflux disease without esophagitis: Secondary | ICD-10-CM | POA: Diagnosis present

## 2020-12-30 DIAGNOSIS — E1140 Type 2 diabetes mellitus with diabetic neuropathy, unspecified: Secondary | ICD-10-CM | POA: Diagnosis not present

## 2020-12-30 DIAGNOSIS — R41841 Cognitive communication deficit: Secondary | ICD-10-CM | POA: Diagnosis not present

## 2020-12-30 DIAGNOSIS — R0989 Other specified symptoms and signs involving the circulatory and respiratory systems: Secondary | ICD-10-CM | POA: Diagnosis not present

## 2020-12-30 DIAGNOSIS — R195 Other fecal abnormalities: Secondary | ICD-10-CM | POA: Diagnosis not present

## 2020-12-30 DIAGNOSIS — G4089 Other seizures: Secondary | ICD-10-CM | POA: Diagnosis not present

## 2020-12-30 DIAGNOSIS — Z7902 Long term (current) use of antithrombotics/antiplatelets: Secondary | ICD-10-CM | POA: Diagnosis not present

## 2020-12-30 DIAGNOSIS — Z20822 Contact with and (suspected) exposure to covid-19: Secondary | ICD-10-CM | POA: Diagnosis not present

## 2020-12-30 DIAGNOSIS — I110 Hypertensive heart disease with heart failure: Secondary | ICD-10-CM | POA: Diagnosis present

## 2020-12-30 DIAGNOSIS — K5900 Constipation, unspecified: Secondary | ICD-10-CM | POA: Diagnosis not present

## 2020-12-30 DIAGNOSIS — E039 Hypothyroidism, unspecified: Secondary | ICD-10-CM | POA: Diagnosis present

## 2020-12-30 DIAGNOSIS — R471 Dysarthria and anarthria: Secondary | ICD-10-CM | POA: Diagnosis not present

## 2020-12-30 DIAGNOSIS — E785 Hyperlipidemia, unspecified: Secondary | ICD-10-CM | POA: Diagnosis present

## 2020-12-30 DIAGNOSIS — N179 Acute kidney failure, unspecified: Secondary | ICD-10-CM | POA: Diagnosis present

## 2020-12-30 DIAGNOSIS — E1142 Type 2 diabetes mellitus with diabetic polyneuropathy: Secondary | ICD-10-CM | POA: Diagnosis present

## 2020-12-30 DIAGNOSIS — I10 Essential (primary) hypertension: Secondary | ICD-10-CM | POA: Diagnosis not present

## 2020-12-30 DIAGNOSIS — N183 Chronic kidney disease, stage 3 unspecified: Secondary | ICD-10-CM | POA: Diagnosis not present

## 2020-12-30 DIAGNOSIS — Z794 Long term (current) use of insulin: Secondary | ICD-10-CM | POA: Diagnosis not present

## 2020-12-30 DIAGNOSIS — I5030 Unspecified diastolic (congestive) heart failure: Secondary | ICD-10-CM | POA: Diagnosis not present

## 2020-12-30 DIAGNOSIS — I639 Cerebral infarction, unspecified: Secondary | ICD-10-CM | POA: Diagnosis not present

## 2020-12-30 DIAGNOSIS — R079 Chest pain, unspecified: Secondary | ICD-10-CM | POA: Diagnosis not present

## 2020-12-30 DIAGNOSIS — N390 Urinary tract infection, site not specified: Secondary | ICD-10-CM | POA: Diagnosis present

## 2020-12-30 DIAGNOSIS — R1311 Dysphagia, oral phase: Secondary | ICD-10-CM | POA: Diagnosis not present

## 2020-12-30 DIAGNOSIS — N2889 Other specified disorders of kidney and ureter: Secondary | ICD-10-CM | POA: Diagnosis not present

## 2020-12-30 DIAGNOSIS — I5032 Chronic diastolic (congestive) heart failure: Secondary | ICD-10-CM | POA: Diagnosis present

## 2020-12-30 DIAGNOSIS — M6281 Muscle weakness (generalized): Secondary | ICD-10-CM | POA: Diagnosis not present

## 2020-12-30 DIAGNOSIS — I69351 Hemiplegia and hemiparesis following cerebral infarction affecting right dominant side: Secondary | ICD-10-CM | POA: Diagnosis not present

## 2020-12-30 DIAGNOSIS — I69318 Other symptoms and signs involving cognitive functions following cerebral infarction: Secondary | ICD-10-CM | POA: Diagnosis not present

## 2020-12-30 DIAGNOSIS — A419 Sepsis, unspecified organism: Secondary | ICD-10-CM | POA: Diagnosis not present

## 2020-12-30 DIAGNOSIS — D519 Vitamin B12 deficiency anemia, unspecified: Secondary | ICD-10-CM | POA: Diagnosis not present

## 2020-12-30 DIAGNOSIS — Z6831 Body mass index (BMI) 31.0-31.9, adult: Secondary | ICD-10-CM | POA: Diagnosis not present

## 2020-12-30 DIAGNOSIS — F0390 Unspecified dementia without behavioral disturbance: Secondary | ICD-10-CM | POA: Diagnosis present

## 2020-12-30 DIAGNOSIS — I63449 Cerebral infarction due to embolism of unspecified cerebellar artery: Secondary | ICD-10-CM | POA: Diagnosis not present

## 2020-12-30 DIAGNOSIS — Z1629 Resistance to other single specified antibiotic: Secondary | ICD-10-CM | POA: Diagnosis present

## 2020-12-30 DIAGNOSIS — R4701 Aphasia: Secondary | ICD-10-CM | POA: Diagnosis not present

## 2020-12-30 DIAGNOSIS — E119 Type 2 diabetes mellitus without complications: Secondary | ICD-10-CM | POA: Diagnosis not present

## 2020-12-30 DIAGNOSIS — U071 COVID-19: Secondary | ICD-10-CM | POA: Diagnosis present

## 2020-12-30 DIAGNOSIS — I69391 Dysphagia following cerebral infarction: Secondary | ICD-10-CM | POA: Diagnosis not present

## 2020-12-30 DIAGNOSIS — I2510 Atherosclerotic heart disease of native coronary artery without angina pectoris: Secondary | ICD-10-CM | POA: Diagnosis not present

## 2020-12-30 DIAGNOSIS — B9620 Unspecified Escherichia coli [E. coli] as the cause of diseases classified elsewhere: Secondary | ICD-10-CM | POA: Diagnosis present

## 2020-12-30 DIAGNOSIS — E669 Obesity, unspecified: Secondary | ICD-10-CM | POA: Diagnosis present

## 2020-12-31 DIAGNOSIS — U071 COVID-19: Secondary | ICD-10-CM | POA: Diagnosis not present

## 2020-12-31 DIAGNOSIS — M6281 Muscle weakness (generalized): Secondary | ICD-10-CM | POA: Diagnosis not present

## 2021-01-01 DIAGNOSIS — I10 Essential (primary) hypertension: Secondary | ICD-10-CM | POA: Diagnosis not present

## 2021-01-01 DIAGNOSIS — U071 COVID-19: Secondary | ICD-10-CM | POA: Diagnosis not present

## 2021-01-02 DIAGNOSIS — N390 Urinary tract infection, site not specified: Secondary | ICD-10-CM | POA: Diagnosis not present

## 2021-01-02 DIAGNOSIS — I10 Essential (primary) hypertension: Secondary | ICD-10-CM | POA: Diagnosis not present

## 2021-01-02 DIAGNOSIS — U071 COVID-19: Secondary | ICD-10-CM | POA: Diagnosis not present

## 2021-01-03 DIAGNOSIS — R195 Other fecal abnormalities: Secondary | ICD-10-CM | POA: Diagnosis not present

## 2021-01-03 DIAGNOSIS — U071 COVID-19: Secondary | ICD-10-CM | POA: Diagnosis not present

## 2021-01-03 DIAGNOSIS — N390 Urinary tract infection, site not specified: Secondary | ICD-10-CM | POA: Diagnosis not present

## 2021-01-06 DIAGNOSIS — R195 Other fecal abnormalities: Secondary | ICD-10-CM | POA: Diagnosis not present

## 2021-01-06 DIAGNOSIS — U071 COVID-19: Secondary | ICD-10-CM | POA: Diagnosis not present

## 2021-01-06 DIAGNOSIS — N390 Urinary tract infection, site not specified: Secondary | ICD-10-CM | POA: Diagnosis not present

## 2021-01-07 DIAGNOSIS — U071 COVID-19: Secondary | ICD-10-CM | POA: Diagnosis not present

## 2021-01-08 DIAGNOSIS — U071 COVID-19: Secondary | ICD-10-CM | POA: Diagnosis not present

## 2021-01-09 DIAGNOSIS — E785 Hyperlipidemia, unspecified: Secondary | ICD-10-CM | POA: Diagnosis not present

## 2021-01-09 DIAGNOSIS — I69351 Hemiplegia and hemiparesis following cerebral infarction affecting right dominant side: Secondary | ICD-10-CM | POA: Diagnosis not present

## 2021-01-09 DIAGNOSIS — I9589 Other hypotension: Secondary | ICD-10-CM | POA: Diagnosis not present

## 2021-01-09 DIAGNOSIS — Z1629 Resistance to other single specified antibiotic: Secondary | ICD-10-CM | POA: Diagnosis present

## 2021-01-09 DIAGNOSIS — E1140 Type 2 diabetes mellitus with diabetic neuropathy, unspecified: Secondary | ICD-10-CM | POA: Diagnosis not present

## 2021-01-09 DIAGNOSIS — I69392 Facial weakness following cerebral infarction: Secondary | ICD-10-CM | POA: Diagnosis not present

## 2021-01-09 DIAGNOSIS — N179 Acute kidney failure, unspecified: Secondary | ICD-10-CM | POA: Diagnosis not present

## 2021-01-09 DIAGNOSIS — I63449 Cerebral infarction due to embolism of unspecified cerebellar artery: Secondary | ICD-10-CM | POA: Diagnosis not present

## 2021-01-09 DIAGNOSIS — E1142 Type 2 diabetes mellitus with diabetic polyneuropathy: Secondary | ICD-10-CM | POA: Diagnosis present

## 2021-01-09 DIAGNOSIS — R1311 Dysphagia, oral phase: Secondary | ICD-10-CM | POA: Diagnosis not present

## 2021-01-09 DIAGNOSIS — I5032 Chronic diastolic (congestive) heart failure: Secondary | ICD-10-CM | POA: Diagnosis present

## 2021-01-09 DIAGNOSIS — R471 Dysarthria and anarthria: Secondary | ICD-10-CM | POA: Diagnosis not present

## 2021-01-09 DIAGNOSIS — I69391 Dysphagia following cerebral infarction: Secondary | ICD-10-CM | POA: Diagnosis not present

## 2021-01-09 DIAGNOSIS — Z7984 Long term (current) use of oral hypoglycemic drugs: Secondary | ICD-10-CM | POA: Diagnosis not present

## 2021-01-09 DIAGNOSIS — I639 Cerebral infarction, unspecified: Secondary | ICD-10-CM | POA: Diagnosis not present

## 2021-01-09 DIAGNOSIS — E669 Obesity, unspecified: Secondary | ICD-10-CM | POA: Diagnosis present

## 2021-01-09 DIAGNOSIS — K5900 Constipation, unspecified: Secondary | ICD-10-CM | POA: Diagnosis not present

## 2021-01-09 DIAGNOSIS — N950 Postmenopausal bleeding: Secondary | ICD-10-CM | POA: Diagnosis present

## 2021-01-09 DIAGNOSIS — D519 Vitamin B12 deficiency anemia, unspecified: Secondary | ICD-10-CM | POA: Diagnosis not present

## 2021-01-09 DIAGNOSIS — I10 Essential (primary) hypertension: Secondary | ICD-10-CM | POA: Diagnosis not present

## 2021-01-09 DIAGNOSIS — B9620 Unspecified Escherichia coli [E. coli] as the cause of diseases classified elsewhere: Secondary | ICD-10-CM | POA: Diagnosis present

## 2021-01-09 DIAGNOSIS — I110 Hypertensive heart disease with heart failure: Secondary | ICD-10-CM | POA: Diagnosis present

## 2021-01-09 DIAGNOSIS — G309 Alzheimer's disease, unspecified: Secondary | ICD-10-CM | POA: Diagnosis not present

## 2021-01-09 DIAGNOSIS — K219 Gastro-esophageal reflux disease without esophagitis: Secondary | ICD-10-CM | POA: Diagnosis present

## 2021-01-09 DIAGNOSIS — E039 Hypothyroidism, unspecified: Secondary | ICD-10-CM | POA: Diagnosis present

## 2021-01-09 DIAGNOSIS — N2889 Other specified disorders of kidney and ureter: Secondary | ICD-10-CM | POA: Diagnosis not present

## 2021-01-09 DIAGNOSIS — E119 Type 2 diabetes mellitus without complications: Secondary | ICD-10-CM | POA: Diagnosis not present

## 2021-01-09 DIAGNOSIS — N390 Urinary tract infection, site not specified: Secondary | ICD-10-CM | POA: Diagnosis present

## 2021-01-09 DIAGNOSIS — Z7902 Long term (current) use of antithrombotics/antiplatelets: Secondary | ICD-10-CM | POA: Diagnosis not present

## 2021-01-09 DIAGNOSIS — J9601 Acute respiratory failure with hypoxia: Secondary | ICD-10-CM | POA: Diagnosis not present

## 2021-01-09 DIAGNOSIS — I69320 Aphasia following cerebral infarction: Secondary | ICD-10-CM | POA: Diagnosis not present

## 2021-01-09 DIAGNOSIS — F0390 Unspecified dementia without behavioral disturbance: Secondary | ICD-10-CM | POA: Diagnosis present

## 2021-01-09 DIAGNOSIS — R41841 Cognitive communication deficit: Secondary | ICD-10-CM | POA: Diagnosis not present

## 2021-01-09 DIAGNOSIS — R4182 Altered mental status, unspecified: Secondary | ICD-10-CM | POA: Diagnosis not present

## 2021-01-09 DIAGNOSIS — R079 Chest pain, unspecified: Secondary | ICD-10-CM | POA: Diagnosis not present

## 2021-01-09 DIAGNOSIS — N183 Chronic kidney disease, stage 3 unspecified: Secondary | ICD-10-CM | POA: Diagnosis not present

## 2021-01-09 DIAGNOSIS — M6281 Muscle weakness (generalized): Secondary | ICD-10-CM | POA: Diagnosis not present

## 2021-01-09 DIAGNOSIS — Z794 Long term (current) use of insulin: Secondary | ICD-10-CM | POA: Diagnosis not present

## 2021-01-09 DIAGNOSIS — A419 Sepsis, unspecified organism: Secondary | ICD-10-CM | POA: Diagnosis not present

## 2021-01-09 DIAGNOSIS — Z6831 Body mass index (BMI) 31.0-31.9, adult: Secondary | ICD-10-CM | POA: Diagnosis not present

## 2021-01-09 DIAGNOSIS — Z7401 Bed confinement status: Secondary | ICD-10-CM | POA: Diagnosis not present

## 2021-01-09 DIAGNOSIS — N189 Chronic kidney disease, unspecified: Secondary | ICD-10-CM | POA: Diagnosis not present

## 2021-01-09 DIAGNOSIS — Z79899 Other long term (current) drug therapy: Secondary | ICD-10-CM | POA: Diagnosis not present

## 2021-01-09 DIAGNOSIS — R0989 Other specified symptoms and signs involving the circulatory and respiratory systems: Secondary | ICD-10-CM | POA: Diagnosis not present

## 2021-01-09 DIAGNOSIS — Z1612 Extended spectrum beta lactamase (ESBL) resistance: Secondary | ICD-10-CM | POA: Diagnosis present

## 2021-01-09 DIAGNOSIS — I2510 Atherosclerotic heart disease of native coronary artery without angina pectoris: Secondary | ICD-10-CM | POA: Diagnosis not present

## 2021-01-09 DIAGNOSIS — Z1159 Encounter for screening for other viral diseases: Secondary | ICD-10-CM | POA: Diagnosis not present

## 2021-01-09 DIAGNOSIS — U071 COVID-19: Secondary | ICD-10-CM | POA: Diagnosis not present

## 2021-01-09 DIAGNOSIS — I5030 Unspecified diastolic (congestive) heart failure: Secondary | ICD-10-CM | POA: Diagnosis not present

## 2021-01-09 DIAGNOSIS — Z20822 Contact with and (suspected) exposure to covid-19: Secondary | ICD-10-CM | POA: Diagnosis not present

## 2021-01-09 DIAGNOSIS — G4089 Other seizures: Secondary | ICD-10-CM | POA: Diagnosis not present

## 2021-01-09 DIAGNOSIS — R4701 Aphasia: Secondary | ICD-10-CM | POA: Diagnosis not present

## 2021-01-09 DIAGNOSIS — I69318 Other symptoms and signs involving cognitive functions following cerebral infarction: Secondary | ICD-10-CM | POA: Diagnosis not present

## 2021-01-13 DIAGNOSIS — Z1159 Encounter for screening for other viral diseases: Secondary | ICD-10-CM | POA: Diagnosis not present

## 2021-01-13 DIAGNOSIS — G459 Transient cerebral ischemic attack, unspecified: Secondary | ICD-10-CM | POA: Diagnosis not present

## 2021-01-13 DIAGNOSIS — I10 Essential (primary) hypertension: Secondary | ICD-10-CM | POA: Diagnosis not present

## 2021-01-13 DIAGNOSIS — Z1612 Extended spectrum beta lactamase (ESBL) resistance: Secondary | ICD-10-CM | POA: Diagnosis not present

## 2021-01-13 DIAGNOSIS — R1310 Dysphagia, unspecified: Secondary | ICD-10-CM | POA: Diagnosis not present

## 2021-01-13 DIAGNOSIS — R059 Cough, unspecified: Secondary | ICD-10-CM | POA: Diagnosis not present

## 2021-01-13 DIAGNOSIS — R1311 Dysphagia, oral phase: Secondary | ICD-10-CM | POA: Diagnosis not present

## 2021-01-13 DIAGNOSIS — J9601 Acute respiratory failure with hypoxia: Secondary | ICD-10-CM | POA: Diagnosis not present

## 2021-01-13 DIAGNOSIS — E1140 Type 2 diabetes mellitus with diabetic neuropathy, unspecified: Secondary | ICD-10-CM | POA: Diagnosis not present

## 2021-01-13 DIAGNOSIS — R918 Other nonspecific abnormal finding of lung field: Secondary | ICD-10-CM | POA: Diagnosis not present

## 2021-01-13 DIAGNOSIS — R41841 Cognitive communication deficit: Secondary | ICD-10-CM | POA: Diagnosis not present

## 2021-01-13 DIAGNOSIS — I69318 Other symptoms and signs involving cognitive functions following cerebral infarction: Secondary | ICD-10-CM | POA: Diagnosis not present

## 2021-01-13 DIAGNOSIS — I63449 Cerebral infarction due to embolism of unspecified cerebellar artery: Secondary | ICD-10-CM | POA: Diagnosis not present

## 2021-01-13 DIAGNOSIS — J189 Pneumonia, unspecified organism: Secondary | ICD-10-CM | POA: Diagnosis not present

## 2021-01-13 DIAGNOSIS — R471 Dysarthria and anarthria: Secondary | ICD-10-CM | POA: Diagnosis not present

## 2021-01-13 DIAGNOSIS — G4734 Idiopathic sleep related nonobstructive alveolar hypoventilation: Secondary | ICD-10-CM | POA: Diagnosis not present

## 2021-01-13 DIAGNOSIS — I69351 Hemiplegia and hemiparesis following cerebral infarction affecting right dominant side: Secondary | ICD-10-CM | POA: Diagnosis not present

## 2021-01-13 DIAGNOSIS — E119 Type 2 diabetes mellitus without complications: Secondary | ICD-10-CM | POA: Diagnosis not present

## 2021-01-13 DIAGNOSIS — I5032 Chronic diastolic (congestive) heart failure: Secondary | ICD-10-CM | POA: Diagnosis not present

## 2021-01-13 DIAGNOSIS — U071 COVID-19: Secondary | ICD-10-CM | POA: Diagnosis not present

## 2021-01-13 DIAGNOSIS — E785 Hyperlipidemia, unspecified: Secondary | ICD-10-CM | POA: Diagnosis not present

## 2021-01-13 DIAGNOSIS — I69391 Dysphagia following cerebral infarction: Secondary | ICD-10-CM | POA: Diagnosis not present

## 2021-01-13 DIAGNOSIS — D519 Vitamin B12 deficiency anemia, unspecified: Secondary | ICD-10-CM | POA: Diagnosis not present

## 2021-01-13 DIAGNOSIS — M6281 Muscle weakness (generalized): Secondary | ICD-10-CM | POA: Diagnosis not present

## 2021-01-13 DIAGNOSIS — I69320 Aphasia following cerebral infarction: Secondary | ICD-10-CM | POA: Diagnosis not present

## 2021-01-13 DIAGNOSIS — N183 Chronic kidney disease, stage 3 unspecified: Secondary | ICD-10-CM | POA: Diagnosis not present

## 2021-01-13 DIAGNOSIS — E039 Hypothyroidism, unspecified: Secondary | ICD-10-CM | POA: Diagnosis not present

## 2021-01-13 DIAGNOSIS — G309 Alzheimer's disease, unspecified: Secondary | ICD-10-CM | POA: Diagnosis not present

## 2021-01-13 DIAGNOSIS — I2510 Atherosclerotic heart disease of native coronary artery without angina pectoris: Secondary | ICD-10-CM | POA: Diagnosis not present

## 2021-01-13 DIAGNOSIS — N390 Urinary tract infection, site not specified: Secondary | ICD-10-CM | POA: Diagnosis not present

## 2021-01-13 DIAGNOSIS — K5900 Constipation, unspecified: Secondary | ICD-10-CM | POA: Diagnosis not present

## 2021-01-13 DIAGNOSIS — I5030 Unspecified diastolic (congestive) heart failure: Secondary | ICD-10-CM | POA: Diagnosis not present

## 2021-01-13 DIAGNOSIS — G4089 Other seizures: Secondary | ICD-10-CM | POA: Diagnosis not present

## 2021-01-13 DIAGNOSIS — A419 Sepsis, unspecified organism: Secondary | ICD-10-CM | POA: Diagnosis not present

## 2021-01-13 DIAGNOSIS — K219 Gastro-esophageal reflux disease without esophagitis: Secondary | ICD-10-CM | POA: Diagnosis not present

## 2021-01-13 DIAGNOSIS — I639 Cerebral infarction, unspecified: Secondary | ICD-10-CM | POA: Diagnosis not present

## 2021-01-13 DIAGNOSIS — R4701 Aphasia: Secondary | ICD-10-CM | POA: Diagnosis not present

## 2021-01-14 DIAGNOSIS — E1140 Type 2 diabetes mellitus with diabetic neuropathy, unspecified: Secondary | ICD-10-CM | POA: Diagnosis not present

## 2021-01-14 DIAGNOSIS — G309 Alzheimer's disease, unspecified: Secondary | ICD-10-CM | POA: Diagnosis not present

## 2021-01-14 DIAGNOSIS — E119 Type 2 diabetes mellitus without complications: Secondary | ICD-10-CM | POA: Diagnosis not present

## 2021-01-14 DIAGNOSIS — M6281 Muscle weakness (generalized): Secondary | ICD-10-CM | POA: Diagnosis not present

## 2021-01-14 DIAGNOSIS — G459 Transient cerebral ischemic attack, unspecified: Secondary | ICD-10-CM | POA: Diagnosis not present

## 2021-01-14 DIAGNOSIS — R1310 Dysphagia, unspecified: Secondary | ICD-10-CM | POA: Diagnosis not present

## 2021-01-14 DIAGNOSIS — I5030 Unspecified diastolic (congestive) heart failure: Secondary | ICD-10-CM | POA: Diagnosis not present

## 2021-01-14 DIAGNOSIS — E785 Hyperlipidemia, unspecified: Secondary | ICD-10-CM | POA: Diagnosis not present

## 2021-01-14 DIAGNOSIS — K5900 Constipation, unspecified: Secondary | ICD-10-CM | POA: Diagnosis not present

## 2021-01-14 DIAGNOSIS — I63449 Cerebral infarction due to embolism of unspecified cerebellar artery: Secondary | ICD-10-CM | POA: Diagnosis not present

## 2021-01-14 DIAGNOSIS — I10 Essential (primary) hypertension: Secondary | ICD-10-CM | POA: Diagnosis not present

## 2021-01-14 DIAGNOSIS — E039 Hypothyroidism, unspecified: Secondary | ICD-10-CM | POA: Diagnosis not present

## 2021-01-21 DIAGNOSIS — U071 COVID-19: Secondary | ICD-10-CM | POA: Diagnosis not present

## 2021-01-21 DIAGNOSIS — G4734 Idiopathic sleep related nonobstructive alveolar hypoventilation: Secondary | ICD-10-CM | POA: Diagnosis not present

## 2021-01-21 DIAGNOSIS — N390 Urinary tract infection, site not specified: Secondary | ICD-10-CM | POA: Diagnosis not present

## 2021-01-21 DIAGNOSIS — A419 Sepsis, unspecified organism: Secondary | ICD-10-CM | POA: Diagnosis not present

## 2021-01-23 DIAGNOSIS — A419 Sepsis, unspecified organism: Secondary | ICD-10-CM | POA: Diagnosis not present

## 2021-01-23 DIAGNOSIS — R1310 Dysphagia, unspecified: Secondary | ICD-10-CM | POA: Diagnosis not present

## 2021-01-23 DIAGNOSIS — N390 Urinary tract infection, site not specified: Secondary | ICD-10-CM | POA: Diagnosis not present

## 2021-01-24 DIAGNOSIS — J189 Pneumonia, unspecified organism: Secondary | ICD-10-CM | POA: Diagnosis not present

## 2021-05-26 DEATH — deceased
